# Patient Record
Sex: Male | Born: 1965 | Race: White | Hispanic: No | Marital: Married | Smoking: Current every day smoker
Health system: Southern US, Community
[De-identification: ages and names within clinical notes are randomized; demographics above are authoritative.]

## PROBLEM LIST (undated history)

## (undated) DIAGNOSIS — F32A Depression, unspecified: Secondary | ICD-10-CM

## (undated) DIAGNOSIS — E119 Type 2 diabetes mellitus without complications: Secondary | ICD-10-CM

## (undated) DIAGNOSIS — I10 Essential (primary) hypertension: Secondary | ICD-10-CM

## (undated) DIAGNOSIS — F419 Anxiety disorder, unspecified: Secondary | ICD-10-CM

## (undated) DIAGNOSIS — G709 Myoneural disorder, unspecified: Secondary | ICD-10-CM

## (undated) DIAGNOSIS — E785 Hyperlipidemia, unspecified: Secondary | ICD-10-CM

## (undated) DIAGNOSIS — T7840XA Allergy, unspecified, initial encounter: Secondary | ICD-10-CM

## (undated) HISTORY — DX: Allergy, unspecified, initial encounter: T78.40XA

## (undated) HISTORY — DX: Hyperlipidemia, unspecified: E78.5

## (undated) HISTORY — DX: Myoneural disorder, unspecified: G70.9

## (undated) HISTORY — DX: Type 2 diabetes mellitus without complications: E11.9

## (undated) HISTORY — DX: Anxiety disorder, unspecified: F41.9

## (undated) HISTORY — DX: Depression, unspecified: F32.A

---

## 2016-06-06 DIAGNOSIS — Z6826 Body mass index (BMI) 26.0-26.9, adult: Secondary | ICD-10-CM | POA: Diagnosis not present

## 2016-06-06 DIAGNOSIS — F411 Generalized anxiety disorder: Secondary | ICD-10-CM | POA: Diagnosis not present

## 2017-08-15 DIAGNOSIS — Z6826 Body mass index (BMI) 26.0-26.9, adult: Secondary | ICD-10-CM | POA: Diagnosis not present

## 2017-08-15 DIAGNOSIS — F410 Panic disorder [episodic paroxysmal anxiety] without agoraphobia: Secondary | ICD-10-CM | POA: Diagnosis not present

## 2017-08-15 DIAGNOSIS — F5221 Male erectile disorder: Secondary | ICD-10-CM | POA: Diagnosis not present

## 2018-02-26 DIAGNOSIS — Z6826 Body mass index (BMI) 26.0-26.9, adult: Secondary | ICD-10-CM | POA: Diagnosis not present

## 2018-02-26 DIAGNOSIS — L03319 Cellulitis of trunk, unspecified: Secondary | ICD-10-CM | POA: Diagnosis not present

## 2018-06-18 DIAGNOSIS — Z720 Tobacco use: Secondary | ICD-10-CM | POA: Diagnosis not present

## 2018-06-18 DIAGNOSIS — Z79899 Other long term (current) drug therapy: Secondary | ICD-10-CM | POA: Diagnosis not present

## 2018-06-18 DIAGNOSIS — F17200 Nicotine dependence, unspecified, uncomplicated: Secondary | ICD-10-CM | POA: Diagnosis not present

## 2018-06-18 DIAGNOSIS — R109 Unspecified abdominal pain: Secondary | ICD-10-CM | POA: Diagnosis not present

## 2018-06-18 DIAGNOSIS — I10 Essential (primary) hypertension: Secondary | ICD-10-CM | POA: Diagnosis not present

## 2018-06-18 DIAGNOSIS — R1031 Right lower quadrant pain: Secondary | ICD-10-CM | POA: Diagnosis not present

## 2018-06-19 DIAGNOSIS — N451 Epididymitis: Secondary | ICD-10-CM | POA: Diagnosis not present

## 2018-06-19 DIAGNOSIS — Z6826 Body mass index (BMI) 26.0-26.9, adult: Secondary | ICD-10-CM | POA: Diagnosis not present

## 2018-06-19 DIAGNOSIS — I10 Essential (primary) hypertension: Secondary | ICD-10-CM | POA: Diagnosis not present

## 2018-08-19 ENCOUNTER — Other Ambulatory Visit: Payer: Self-pay

## 2018-08-19 ENCOUNTER — Observation Stay (HOSPITAL_COMMUNITY)
Admission: EM | Admit: 2018-08-19 | Discharge: 2018-08-20 | Disposition: A | Discharge status: Home / Self Care | Payer: BLUE CROSS/BLUE SHIELD | Attending: Internal Medicine | Admitting: Internal Medicine

## 2018-08-19 ENCOUNTER — Emergency Department (HOSPITAL_COMMUNITY): Payer: BLUE CROSS/BLUE SHIELD

## 2018-08-19 ENCOUNTER — Encounter (HOSPITAL_COMMUNITY): Payer: Self-pay | Admitting: Emergency Medicine

## 2018-08-19 DIAGNOSIS — F10920 Alcohol use, unspecified with intoxication, uncomplicated: Secondary | ICD-10-CM

## 2018-08-19 DIAGNOSIS — F17210 Nicotine dependence, cigarettes, uncomplicated: Secondary | ICD-10-CM | POA: Insufficient documentation

## 2018-08-19 DIAGNOSIS — F10929 Alcohol use, unspecified with intoxication, unspecified: Secondary | ICD-10-CM | POA: Insufficient documentation

## 2018-08-19 DIAGNOSIS — Z79899 Other long term (current) drug therapy: Secondary | ICD-10-CM | POA: Insufficient documentation

## 2018-08-19 DIAGNOSIS — R404 Transient alteration of awareness: Secondary | ICD-10-CM | POA: Diagnosis not present

## 2018-08-19 DIAGNOSIS — R45851 Suicidal ideations: Secondary | ICD-10-CM | POA: Diagnosis not present

## 2018-08-19 DIAGNOSIS — F10129 Alcohol abuse with intoxication, unspecified: Secondary | ICD-10-CM | POA: Diagnosis present

## 2018-08-19 DIAGNOSIS — F32A Depression, unspecified: Secondary | ICD-10-CM | POA: Diagnosis present

## 2018-08-19 DIAGNOSIS — R945 Abnormal results of liver function studies: Secondary | ICD-10-CM | POA: Insufficient documentation

## 2018-08-19 DIAGNOSIS — F329 Major depressive disorder, single episode, unspecified: Secondary | ICD-10-CM | POA: Diagnosis not present

## 2018-08-19 DIAGNOSIS — R55 Syncope and collapse: Secondary | ICD-10-CM | POA: Diagnosis not present

## 2018-08-19 DIAGNOSIS — R0689 Other abnormalities of breathing: Secondary | ICD-10-CM | POA: Diagnosis not present

## 2018-08-19 DIAGNOSIS — F10120 Alcohol abuse with intoxication, uncomplicated: Secondary | ICD-10-CM | POA: Diagnosis not present

## 2018-08-19 DIAGNOSIS — R0902 Hypoxemia: Secondary | ICD-10-CM | POA: Diagnosis not present

## 2018-08-19 DIAGNOSIS — R4020 Unspecified coma: Secondary | ICD-10-CM | POA: Diagnosis not present

## 2018-08-19 LAB — CBC WITH DIFFERENTIAL/PLATELET
BASOS ABS: 0.1 10*3/uL (ref 0.0–0.1)
Basophils Relative: 1 %
EOS PCT: 3 %
Eosinophils Absolute: 0.2 10*3/uL (ref 0.0–0.7)
HCT: 37.8 % — ABNORMAL LOW (ref 39.0–52.0)
Hemoglobin: 12.9 g/dL — ABNORMAL LOW (ref 13.0–17.0)
LYMPHS PCT: 42 %
Lymphs Abs: 2.4 10*3/uL (ref 0.7–4.0)
MCH: 32.9 pg (ref 26.0–34.0)
MCHC: 34.1 g/dL (ref 30.0–36.0)
MCV: 96.4 fL (ref 78.0–100.0)
MONO ABS: 0.3 10*3/uL (ref 0.1–1.0)
Monocytes Relative: 6 %
Neutro Abs: 2.8 10*3/uL (ref 1.7–7.7)
Neutrophils Relative %: 48 %
PLATELETS: 235 10*3/uL (ref 150–400)
RBC: 3.92 MIL/uL — ABNORMAL LOW (ref 4.22–5.81)
RDW: 13.1 % (ref 11.5–15.5)
WBC: 5.8 10*3/uL (ref 4.0–10.5)

## 2018-08-19 LAB — COMPREHENSIVE METABOLIC PANEL
ALT: 47 U/L — ABNORMAL HIGH (ref 0–44)
AST: 30 U/L (ref 15–41)
Albumin: 3.9 g/dL (ref 3.5–5.0)
Alkaline Phosphatase: 62 U/L (ref 38–126)
Anion gap: 10 (ref 5–15)
BILIRUBIN TOTAL: 0.5 mg/dL (ref 0.3–1.2)
BUN: 9 mg/dL (ref 6–20)
CHLORIDE: 100 mmol/L (ref 98–111)
CO2: 24 mmol/L (ref 22–32)
Calcium: 9 mg/dL (ref 8.9–10.3)
Creatinine, Ser: 0.58 mg/dL — ABNORMAL LOW (ref 0.61–1.24)
GFR calc Af Amer: >60 mL/min (ref >60)
Glucose, Bld: 103 mg/dL — ABNORMAL HIGH (ref 70–99)
Potassium: 3.7 mmol/L (ref 3.5–5.1)
Sodium: 134 mmol/L — ABNORMAL LOW (ref 135–145)
TOTAL PROTEIN: 7.2 g/dL (ref 6.5–8.1)

## 2018-08-19 LAB — URINALYSIS, ROUTINE W REFLEX MICROSCOPIC
BACTERIA UA: NONE SEEN
BILIRUBIN URINE: NEGATIVE
GLUCOSE, UA: NEGATIVE mg/dL
HGB URINE DIPSTICK: NEGATIVE
Ketones, ur: NEGATIVE mg/dL
Nitrite: NEGATIVE
PROTEIN: NEGATIVE mg/dL
SPECIFIC GRAVITY, URINE: 1.008 (ref 1.005–1.030)
pH: 7 (ref 5.0–8.0)

## 2018-08-19 LAB — RAPID URINE DRUG SCREEN, HOSP PERFORMED
AMPHETAMINES: NOT DETECTED
BENZODIAZEPINES: NOT DETECTED
Barbiturates: NOT DETECTED
Cocaine: NOT DETECTED
Opiates: NOT DETECTED
Tetrahydrocannabinol: NOT DETECTED

## 2018-08-19 LAB — ETHANOL: ALCOHOL ETHYL (B): 229 mg/dL — AB (ref <10)

## 2018-08-19 LAB — TROPONIN I

## 2018-08-19 NOTE — ED Triage Notes (Addendum)
Pt brought in by EMS. Pts family called EMS after pt drank 4 mixed drinks and became unresponsive. Pt denies any drug use. Per family CPR was started at home by family member.

## 2018-08-19 NOTE — H&P (Signed)
Patient Demographics:    William Chapman, is a 52 y.o. male  MRN: [id number]   DOB - 01-02-1966  Admit Date - 08/19/2018  Outpatient Primary MD for the patient is Sasser, Clarene Critchley, MD   Assessment & Plan:    Active Problems:   Syncope and collapse   Alcohol abuse with intoxication/BAL 229   Depression    1)Syncope- place obs on telemetry monitored unit, watch for arrhythmias, check serial troponins and EKG for rule out ACS protocol, Hold off on  Echocardiogram and hold off on carotid artery Dopplers as syncope appears to be related to EtOH intoxication rather than any Neuro or Cardiovascular disorder at this time.  Neurochecks every 4 hours as ordered.  CT head without acute findings  2)Depression with suicidal ideation but no suicidal plan--- wife  states patient was just talking out of his head because he was drunk, one-to-one sitter for now, consider psychiatric evaluation once patient is sober, continue Effexor  3)EToh intoxication---- BAL 229, Lorazepam per CIWA protocol, folic acid/MVI/thiamine as ordered, IV banana bag ordered   With History of - PMHx--- Reviewed by me  Depression etoh abuse   Chief Complaint  Patient presents with  . Alcohol Intoxication      HPI:    William Chapman  is a 52 y.o. male with past medical history relevant for alcohol abuse and depression presents to the ED after passing out in his kitchen at home.   Wife and family friend at bedside  Patient's wife states that patient has been drinking due to family stress with son-in-law and daughter.... He drank quite a bit of vodka he got up to go get some food from the kitchen passed out and hit the back of his head on the floor, neighbor tried doing CPR but patient was not really out out...  When EMS arrived in route patient on  his side the patient had episode of emesis  Prior to passing out and after passing out patient denied chest pain, palpitation, dizziness headache visual disturbance or any other acute concerns  Specifically denies pleuritic symptoms or leg pains or leg swelling  No tongue biting, no incontinence no concerns about seizures  IN ED... Patient is lethargic but arousable, able to answer my questions but then falls back asleep  In ED----CT head is without acute findings, troponin negative, blood alcohol level is BAL- 229, urine drug screen is negative, UA unremarkable    Review of systems:    In addition to the HPI above,   A full Review of  Systems was done, all other systems reviewed are negative except as noted above in HPI    Social History:  Reviewed by me    Social History   Tobacco Use  . Smoking status: Current Every Day Smoker    Packs/day: 1.00  . Smokeless tobacco: Never Used  Substance Use Topics  . Alcohol use: Yes  Family History :  Reviewed by me   HTN   Home Medications:   Prior to Admission medications   Medication Sig Start Date End Date Taking? Authorizing Provider  venlafaxine XR (EFFEXOR-XR) 150 MG 24 hr capsule Take 150 mg by mouth daily with breakfast.  07/14/18  Yes [provider]     Allergies:    No Known Allergies   Physical Exam:   Vitals  Blood pressure 136/77, pulse 90, temperature 97.9 F (36.6 C), temperature source Oral, resp. rate 18, height 5\' 8"  (1.727 m), weight 81.6 kg, SpO2 96 %.  Physical Examination: General appearance -falls asleep easily,  and in no distress Mental status - alert, oriented to person, place, and time, intoxicated and falls asleep easily Eyes - sclera anicteric Neck - supple, no JVD elevation , Chest - clear  to auscultation bilaterally, symmetrical air movement,  Heart - S1 and S2 normal,  Abdomen - soft, nontender, nondistended, no masses or organomegaly Neurological -intoxicated and  falls asleep easily, neck supple without rigidity, cranial nerves II through XII intact, DTR's normal and symmetric Extremities - no pedal edema noted, intact peripheral pulses  Skin - warm, dry     Data Review:    CBC Recent Labs  Lab 08/19/18 2018  WBC 5.8  HGB 12.9*  HCT 37.8*  PLT 235  MCV 96.4  MCH 32.9  MCHC 34.1  RDW 13.1  LYMPHSABS 2.4  MONOABS 0.3  EOSABS 0.2  BASOSABS 0.1   ------------------------------------------------------------------------------------------------------------------  Chemistries  Recent Labs  Lab 08/19/18 2018  NA 134*  K 3.7  CL 100  CO2 24  GLUCOSE 103*  BUN 9  CREATININE 0.58*  CALCIUM 9.0  AST 30  ALT 47*  ALKPHOS 62  BILITOT 0.5   ------------------------------------------------------------------------------------------------------------------ estimated creatinine clearance is 105.7 mL/min (A) (by C-G formula based on SCr of 0.58 mg/dL (L)). ------------------------------------------------------------------------------------------------------------------ No results for input(s): TSH, T4TOTAL, T3FREE, THYROIDAB in the last 72 hours.  Invalid input(s): FREET3   Coagulation profile No results for input(s): INR, PROTIME in the last 168 hours. ------------------------------------------------------------------------------------------------------------------- No results for input(s): DDIMER in the last 72 hours. -------------------------------------------------------------------------------------------------------------------  Cardiac Enzymes Recent Labs  Lab 08/19/18 2018  TROPONINI <0.03   ------------------------------------------------------------------------------------------------------------------ No results found for: BNP   ---------------------------------------------------------------------------------------------------------------  Urinalysis    Component Value Date/Time   COLORURINE STRAW (A) 08/19/2018  2130   APPEARANCEUR CLEAR 08/19/2018 2130   LABSPEC 1.008 08/19/2018 2130   PHURINE 7.0 08/19/2018 2130   GLUCOSEU NEGATIVE 08/19/2018 2130   HGBUR NEGATIVE 08/19/2018 2130   BILIRUBINUR NEGATIVE 08/19/2018 2130   KETONESUR NEGATIVE 08/19/2018 2130   PROTEINUR NEGATIVE 08/19/2018 2130   NITRITE NEGATIVE 08/19/2018 2130   LEUKOCYTESUR TRACE (A) 08/19/2018 2130    ----------------------------------------------------------------------------------------------------------------   Imaging Results:    Dg Chest 2 View  Result Date: 08/19/2018 CLINICAL DATA:  52 year old who became unresponsive after 4 mixed drinks. Query syncope. EXAM: CHEST - 2 VIEW COMPARISON:  None. FINDINGS: Cardiomediastinal silhouette unremarkable. Mildly prominent bronchovascular markings diffusely and mild central peribronchial thickening. Lungs otherwise clear. No localized airspace consolidation. No pleural effusions. No pneumothorax. Normal pulmonary vascularity. Visualized bony thorax intact. IMPRESSION: Mild changes of chronic bronchitis and/or asthma. No acute cardiopulmonary disease. Electronically Signed   By: Hulan Saas M.D.   On: 08/19/2018 20:44   Ct Head Wo Contrast  Result Date: 08/20/2018 CLINICAL DATA:  52 y/o  M; syncopal episode with head injury. EXAM: CT HEAD WITHOUT CONTRAST TECHNIQUE: Contiguous axial images were obtained from  the base of the skull through the vertex without intravenous contrast. COMPARISON:  None. FINDINGS: Brain: No evidence of acute infarction, hemorrhage, hydrocephalus, extra-axial collection or mass lesion/mass effect. Vascular: No hyperdense vessel or unexpected calcification. Skull: Normal. Negative for fracture or focal lesion. Sinuses/Orbits: No acute finding. Other: None. IMPRESSION: Negative CT of the head. Electronically Signed   By: Mitzi Hansen M.D.   On: 08/20/2018 00:21    Radiological Exams on Admission: Dg Chest 2 View  Result Date:  08/19/2018 CLINICAL DATA:  52 year old who became unresponsive after 4 mixed drinks. Query syncope. EXAM: CHEST - 2 VIEW COMPARISON:  None. FINDINGS: Cardiomediastinal silhouette unremarkable. Mildly prominent bronchovascular markings diffusely and mild central peribronchial thickening. Lungs otherwise clear. No localized airspace consolidation. No pleural effusions. No pneumothorax. Normal pulmonary vascularity. Visualized bony thorax intact. IMPRESSION: Mild changes of chronic bronchitis and/or asthma. No acute cardiopulmonary disease. Electronically Signed   By: Hulan Saas M.D.   On: 08/19/2018 20:44   Ct Head Wo Contrast  Result Date: 08/20/2018 CLINICAL DATA:  52 y/o  M; syncopal episode with head injury. EXAM: CT HEAD WITHOUT CONTRAST TECHNIQUE: Contiguous axial images were obtained from the base of the skull through the vertex without intravenous contrast. COMPARISON:  None. FINDINGS: Brain: No evidence of acute infarction, hemorrhage, hydrocephalus, extra-axial collection or mass lesion/mass effect. Vascular: No hyperdense vessel or unexpected calcification. Skull: Normal. Negative for fracture or focal lesion. Sinuses/Orbits: No acute finding. Other: None. IMPRESSION: Negative CT of the head. Electronically Signed   By: Mitzi Hansen M.D.   On: 08/20/2018 00:21    DVT Prophylaxis -SCD  /heparin AM Labs Ordered, also please review Full Orders  Family Communication: Admission, patients condition and plan of care including tests being ordered have been discussed with the patient and his wife at bedside who indicate understanding and agree with the plan   Code Status - Full Code  Likely DC to  home  Condition   stable  Shon Hale M.D on 08/20/2018 at 2:44 AM Pager---[phone] Go to [email or web address] - password [email or web address] for contact info  [facility] - Office  [phone]

## 2018-08-19 NOTE — ED Notes (Signed)
Pt placed on 2L [place] due to O2 82% while sleeping. MD pickering notified.

## 2018-08-19 NOTE — ED Provider Notes (Signed)
[facility] EMERGENCY DEPARTMENT Provider Note   CSN: [id number] Arrival date & time: 08/19/18  1932     History   Chief Complaint Chief Complaint  Patient presents with  . Alcohol Intoxication    HPI William Chapman is a 52 y.o. male.  HPI Patient presents after syncopal episode.  Reportedly had been drinking somewhat heavily at home.  Does drink a couple drinks at a time but reportedly drank more today.  He is upset over some family issues that have been going on.  Was walking and then had a syncopal episode where he fell backwards.  Reported CPR had been done by a neighbor that was come by.  Reportedly was not breathing.  Woke up after it.  Is tearful now and states some suicidal statements.  Has been had trouble with his family.  Reported he had a syncopal episode a few years ago that he never went to the hospital for.  After the syncopal episode today patient vomited. History reviewed. No pertinent past medical history.  There are no active problems to display for this patient.   History reviewed. No pertinent surgical history.      Home Medications    Prior to Admission medications   Medication Sig Start Date End Date Taking? Authorizing Provider  HYDROcodone-acetaminophen (NORCO/VICODIN) 5-325 MG tablet Take 1 tablet by mouth every 6 (six) hours as needed.  08/11/18   [provider]  venlafaxine XR (EFFEXOR-XR) 150 MG 24 hr capsule Take 150 mg by mouth daily with breakfast.  07/14/18   [provider]    Family History No family history on file.  Social History Social History   Tobacco Use  . Smoking status: Current Every Day Smoker    Packs/day: 1.00  . Smokeless tobacco: Never Used  Substance Use Topics  . Alcohol use: Yes  . Drug use: Not Currently     Allergies   Patient has no allergy information on record.   Review of Systems Review of Systems  Constitutional: Negative for appetite change.  HENT: Negative for congestion.     Respiratory: Negative for shortness of breath.   Cardiovascular: Negative for chest pain.  Genitourinary: Negative for frequency.  Musculoskeletal: Negative for gait problem.  Neurological: Positive for syncope.  Psychiatric/Behavioral: Positive for suicidal ideas.     Physical Exam Updated Vital Signs BP (!) 146/109   Pulse 71   Resp 14   Ht 5\' 8"  (1.727 m)   Wt 81.6 kg   SpO2 99%   BMI 27.37 kg/m   Physical Exam  Constitutional: He appears well-developed.  HENT:  Head: Atraumatic.  Eyes: EOM are normal.  Cardiovascular: Normal rate.  Pulmonary/Chest: Effort normal.  Abdominal: Soft.  Musculoskeletal: He exhibits no edema.  Neurological: He is alert.  Skin: Skin is warm.  Psychiatric:  Patient is tearful.     ED Treatments / Results  Labs (all labs ordered are listed, but only abnormal results are displayed) Labs Reviewed  COMPREHENSIVE METABOLIC PANEL - Abnormal; Notable for the following components:      Result Value   Sodium 134 (*)    Glucose, Bld 103 (*)    Creatinine, Ser 0.58 (*)    ALT 47 (*)    All other components within normal limits  ETHANOL - Abnormal; Notable for the following components:   Alcohol, Ethyl (B) 229 (*)    All other components within normal limits  CBC WITH DIFFERENTIAL/PLATELET - Abnormal; Notable for the following components:  RBC 3.92 (*)    Hemoglobin 12.9 (*)    HCT 37.8 (*)    All other components within normal limits  TROPONIN I  RAPID URINE DRUG SCREEN, HOSP PERFORMED  URINALYSIS, ROUTINE W REFLEX MICROSCOPIC    EKG EKG Interpretation  Date/Time:  Wednesday August 19 2018 19:37:29 EDT Ventricular Rate:  88 PR Interval:    QRS Duration: 102 QT Interval:  366 QTC Calculation: 443 R Axis:   57 Text Interpretation:  Sinus rhythm RSR' in V1 or V2, probably normal variant Confirmed by Benjiman Core [phone]) on 08/19/2018 8:50:20 PM   Radiology Dg Chest 2 View  Result Date: 08/19/2018 CLINICAL DATA:   52 year old who became unresponsive after 4 mixed drinks. Query syncope. EXAM: CHEST - 2 VIEW COMPARISON:  None. FINDINGS: Cardiomediastinal silhouette unremarkable. Mildly prominent bronchovascular markings diffusely and mild central peribronchial thickening. Lungs otherwise clear. No localized airspace consolidation. No pleural effusions. No pneumothorax. Normal pulmonary vascularity. Visualized bony thorax intact. IMPRESSION: Mild changes of chronic bronchitis and/or asthma. No acute cardiopulmonary disease. Electronically Signed   By: Hulan Saas M.D.   On: 08/19/2018 20:44    Procedures Procedures (including critical care time)  Medications Ordered in ED Medications - No data to display   Initial Impression / Assessment and Plan / ED Course  I have reviewed the triage vital signs and the nursing notes.  Pertinent labs & imaging results that were available during my care of the patient were reviewed by me and considered in my medical decision making (see chart for details).     Patient presented after syncopal episode.  Had been drinking alcohol tonight and drinking somewhat heavily but then became unresponsive to the point that he required CPR.  Minus elevated alcohol lab work is reassuring.  EKG reassuring.  Had made some suicidal statements to staff members however.  With the syncope to the point of CPR I think patient would benefit from admission to the hospital.  Will discuss with hospitalist  Final Clinical Impressions(s) / ED Diagnoses   Final diagnoses:  Syncope, unspecified syncope type  Alcoholic intoxication without complication [facility])    ED Discharge Orders    None       Benjiman Core, MD 08/19/18 2150

## 2018-08-20 DIAGNOSIS — F329 Major depressive disorder, single episode, unspecified: Secondary | ICD-10-CM

## 2018-08-20 DIAGNOSIS — R55 Syncope and collapse: Secondary | ICD-10-CM

## 2018-08-20 DIAGNOSIS — F32A Depression, unspecified: Secondary | ICD-10-CM | POA: Diagnosis present

## 2018-08-20 DIAGNOSIS — S0990XA Unspecified injury of head, initial encounter: Secondary | ICD-10-CM | POA: Diagnosis not present

## 2018-08-20 DIAGNOSIS — F10129 Alcohol abuse with intoxication, unspecified: Secondary | ICD-10-CM | POA: Diagnosis not present

## 2018-08-20 LAB — BASIC METABOLIC PANEL
ANION GAP: 9 (ref 5–15)
BUN: 11 mg/dL (ref 6–20)
CALCIUM: 8.9 mg/dL (ref 8.9–10.3)
CO2: 25 mmol/L (ref 22–32)
Chloride: 105 mmol/L (ref 98–111)
Creatinine, Ser: 0.71 mg/dL (ref 0.61–1.24)
Glucose, Bld: 107 mg/dL — ABNORMAL HIGH (ref 70–99)
Potassium: 3.8 mmol/L (ref 3.5–5.1)
Sodium: 139 mmol/L (ref 135–145)

## 2018-08-20 LAB — HEPATIC FUNCTION PANEL
ALT: 45 U/L — AB (ref 0–44)
AST: 36 U/L (ref 15–41)
Albumin: 3.4 g/dL — ABNORMAL LOW (ref 3.5–5.0)
Alkaline Phosphatase: 61 U/L (ref 38–126)
Bilirubin, Direct: <0.1 mg/dL (ref 0.0–0.2)
TOTAL PROTEIN: 6.3 g/dL — AB (ref 6.5–8.1)
Total Bilirubin: 0.1 mg/dL — ABNORMAL LOW (ref 0.3–1.2)

## 2018-08-20 LAB — CBC
HCT: 36.9 % — ABNORMAL LOW (ref 39.0–52.0)
HEMOGLOBIN: 12.4 g/dL — AB (ref 13.0–17.0)
MCH: 32.9 pg (ref 26.0–34.0)
MCHC: 33.6 g/dL (ref 30.0–36.0)
MCV: 97.9 fL (ref 78.0–100.0)
PLATELETS: 222 10*3/uL (ref 150–400)
RBC: 3.77 MIL/uL — AB (ref 4.22–5.81)
RDW: 13.2 % (ref 11.5–15.5)
WBC: 5.3 10*3/uL (ref 4.0–10.5)

## 2018-08-20 LAB — TROPONIN I

## 2018-08-20 MED ORDER — THIAMINE HCL 100 MG PO TABS: 100.0000 mg | ORAL_TABLET | Freq: Every day | ORAL | 0 refills | Status: DC

## 2018-08-20 MED ORDER — FOLIC ACID 1 MG PO TABS: 1.0000 mg | ORAL_TABLET | Freq: Every day | ORAL | 0 refills | Status: DC

## 2018-08-20 MED ORDER — SODIUM CHLORIDE 0.9 % IV SOLN: 250.0000 mL | INTRAVENOUS | Status: DC | PRN

## 2018-08-20 MED ORDER — ACETAMINOPHEN 650 MG RE SUPP: 650.0000 mg | Freq: Four times a day (QID) | RECTAL | Status: DC | PRN

## 2018-08-20 MED ORDER — ADULT MULTIVITAMIN W/MINERALS CH
1.0000 | ORAL_TABLET | Freq: Every day | ORAL | Status: DC
  Administered 2018-08-20: 1 via ORAL
  Filled 2018-08-20: qty 1

## 2018-08-20 MED ORDER — LORAZEPAM 1 MG PO TABS: 1.0000 mg | ORAL_TABLET | Freq: Four times a day (QID) | ORAL | Status: DC | PRN

## 2018-08-20 MED ORDER — TRAZODONE HCL 50 MG PO TABS: 50.0000 mg | ORAL_TABLET | Freq: Every evening | ORAL | Status: DC | PRN

## 2018-08-20 MED ORDER — ONDANSETRON HCL 4 MG PO TABS: 4.0000 mg | ORAL_TABLET | Freq: Four times a day (QID) | ORAL | Status: DC | PRN

## 2018-08-20 MED ORDER — FOLIC ACID 1 MG PO TABS
1.0000 mg | ORAL_TABLET | Freq: Every day | ORAL | Status: DC
  Administered 2018-08-20: 1 mg via ORAL
  Filled 2018-08-20: qty 1

## 2018-08-20 MED ORDER — ALBUTEROL SULFATE (2.5 MG/3ML) 0.083% IN NEBU: 2.5000 mg | INHALATION_SOLUTION | RESPIRATORY_TRACT | Status: DC | PRN

## 2018-08-20 MED ORDER — HEPARIN SODIUM (PORCINE) 5000 UNIT/ML IJ SOLN
5000.0000 [IU] | Freq: Three times a day (TID) | INTRAMUSCULAR | Status: DC
  Administered 2018-08-20: 5000 [IU] via SUBCUTANEOUS
  Filled 2018-08-20: qty 1

## 2018-08-20 MED ORDER — THIAMINE HCL 100 MG/ML IJ SOLN
INTRAMUSCULAR | Status: AC
  Filled 2018-08-20: qty 2

## 2018-08-20 MED ORDER — ONDANSETRON HCL 4 MG/2ML IJ SOLN: 4.0000 mg | Freq: Four times a day (QID) | INTRAMUSCULAR | Status: DC | PRN

## 2018-08-20 MED ORDER — THIAMINE HCL 100 MG/ML IJ SOLN
Freq: Once | INTRAVENOUS | Status: AC
  Administered 2018-08-20: 02:00:00 via INTRAVENOUS
  Filled 2018-08-20: qty 1000

## 2018-08-20 MED ORDER — THIAMINE HCL 100 MG/ML IJ SOLN: 100.0000 mg | Freq: Every day | INTRAMUSCULAR | Status: DC

## 2018-08-20 MED ORDER — LORAZEPAM 2 MG/ML IJ SOLN: 1.0000 mg | Freq: Four times a day (QID) | INTRAMUSCULAR | Status: DC | PRN

## 2018-08-20 MED ORDER — SODIUM CHLORIDE 0.9% FLUSH
3.0000 mL | Freq: Two times a day (BID) | INTRAVENOUS | Status: DC
  Administered 2018-08-20: 3 mL via INTRAVENOUS

## 2018-08-20 MED ORDER — SODIUM CHLORIDE 0.9% FLUSH: 3.0000 mL | INTRAVENOUS | Status: DC | PRN

## 2018-08-20 MED ORDER — M.V.I. ADULT IV INJ
INJECTION | INTRAVENOUS | Status: AC
  Filled 2018-08-20: qty 10

## 2018-08-20 MED ORDER — VITAMIN B-1 100 MG PO TABS
100.0000 mg | ORAL_TABLET | Freq: Every day | ORAL | Status: DC
  Administered 2018-08-20: 100 mg via ORAL
  Filled 2018-08-20: qty 1

## 2018-08-20 MED ORDER — SODIUM CHLORIDE 0.9 % IV SOLN
INTRAVENOUS | Status: DC
  Administered 2018-08-20: 06:00:00 via INTRAVENOUS

## 2018-08-20 MED ORDER — FOLIC ACID 5 MG/ML IJ SOLN
INTRAMUSCULAR | Status: AC
  Filled 2018-08-20: qty 0.2

## 2018-08-20 MED ORDER — ADULT MULTIVITAMIN W/MINERALS CH: 1.0000 | ORAL_TABLET | Freq: Every day | ORAL | 0 refills | Status: DC

## 2018-08-20 MED ORDER — ACETAMINOPHEN 325 MG PO TABS: 650.0000 mg | ORAL_TABLET | Freq: Four times a day (QID) | ORAL | Status: DC | PRN

## 2018-08-20 MED ORDER — VENLAFAXINE HCL ER 75 MG PO CP24
150.0000 mg | ORAL_CAPSULE | Freq: Every day | ORAL | Status: DC
  Administered 2018-08-20: 150 mg via ORAL
  Filled 2018-08-20: qty 2

## 2018-08-20 MED ORDER — POLYETHYLENE GLYCOL 3350 17 G PO PACK: 17.0000 g | PACK | Freq: Every day | ORAL | Status: DC | PRN

## 2018-08-20 NOTE — Progress Notes (Signed)
Pt discharged home today per Dr. Mikhail. Pt's IV site D/C'd and WDL. Pt's VSS. Pt provided with home medication list, discharge instructions and prescriptions. Verbalized understanding. Pt left floor via WC in stable condition accompanied by NT. 

## 2018-08-20 NOTE — Discharge Instructions (Signed)
Alcohol Abuse and Nutrition °Alcohol abuse is any pattern of alcohol consumption that harms your health, relationships, or work. Alcohol abuse can affect how your body breaks down and absorbs nutrients from food by causing your liver to work abnormally. Additionally, many people who abuse alcohol do not eat enough carbohydrates, protein, fat, vitamins, and minerals. This can cause poor nutrition (malnutrition) and a lack of nutrients (nutrient deficiencies), which can lead to further complications. °Nutrients that are commonly lacking (deficient) among people who abuse alcohol include: °· Vitamins. °? Vitamin A. This is stored in your liver. It is important for your vision, metabolism, and ability to fight off infections (immunity). °? B vitamins. These include vitamins such as folate, thiamin, and niacin. These are important in new cell growth and maintenance. °? Vitamin C. This plays an important role in iron absorption, wound healing, and immunity. °? Vitamin D. This is produced by your liver, but you can also get vitamin D from food. Vitamin D is necessary for your body to absorb and use calcium. °· Minerals. °? Calcium. This is important for your bones and your heart and blood vessel (cardiovascular) function. °? Iron. This is important for blood, muscle, and nervous system functioning. °? Magnesium. This plays an important role in muscle and nerve function, and it helps to control blood sugar and blood pressure. °? Zinc. This is important for the normal function of your nervous system and digestive system (gastrointestinal tract). ° °Nutrition is an essential component of therapy for alcohol abuse. Your health care provider or dietitian will work with you to design a plan that can help restore nutrients to your body and prevent potential complications. °What is my plan? °Your dietitian may develop a specific diet plan that is based on your condition and any other complications you may have. A diet plan will  commonly include: °· A balanced diet. °? Grains: 6-8 oz per day. °? Vegetables: 2-3 cups per day. °? Fruits: 1-2 cups per day. °? Meat and other protein: 5-6 oz per day. °? Dairy: 2-3 cups per day. °· Vitamin and mineral supplements. ° °What do I need to know about alcohol and nutrition? °· Consume foods that are high in antioxidants, such as grapes, berries, nuts, green tea, and dark green and orange vegetables. This can help to counteract some of the stress that is placed on your liver by consuming alcohol. °· Avoid food and drinks that are high in fat and sugar. Foods such as sugared soft drinks, salty snack foods, and candy contain empty calories. This means that they lack important nutrients such as protein, fiber, and vitamins. °· Eat frequent meals and snacks. Try to eat 5-6 small meals each day. °· Eat a variety of fresh fruits and vegetables each day. This will help you get plenty of water, fiber, and vitamins in your diet. °· Drink plenty of water and other clear fluids. Try to drink at least 48-64 oz (1.5-2 L) of water per day. °· If you are a vegetarian, eat a variety of protein-rich foods. Pair whole grains with plant-based proteins at meals and snacks to obtain the greatest nutrient benefit from your food. For example, eat rice with beans, put peanut butter on whole-grain toast, or eat oatmeal with sunflower seeds. °· Soak beans and whole grains overnight before cooking. This can help your body to absorb the nutrients more easily. °· Include foods fortified with vitamins and minerals in your diet. Commonly fortified foods include milk, orange juice, cereal, and bread. °·   If you are malnourished, your dietitian may recommend a high-protein, high-calorie diet. This may include: °? 2,000-3,000 calories (kilocalories) per day. °? 70-100 grams of protein per day. °· Your health care provider may recommend a complete nutritional supplement beverage. This can help to restore calories, protein, and vitamins to  your body. Depending on your condition, you may be advised to consume this instead of or in addition to meals. °· Limit your intake of caffeine. Replace drinks like coffee and black tea with decaffeinated coffee and herbal tea. °· Eat a variety of foods that are high in omega fatty acids. These include fish, nuts and seeds, and soybeans. These foods may help your liver to recover and may also stabilize your mood. °· Certain medicines may cause changes in your appetite, taste, and weight. Work with your health care provider and dietitian to make any adjustments to your medicines and diet plan. °· Include other healthy lifestyle choices in your daily routine. °? Be physically active. °? Get enough sleep. °? Spend time doing activities that you enjoy. °· If you are unable to take in enough food and calories by mouth, your health care provider may recommend a feeding tube. This is a tube that passes through your nose and throat, directly into your stomach. Nutritional supplement beverages can be given to you through the feeding tube to help you get the nutrients you need. °· Take vitamin or mineral supplements as recommended by your health care provider. °What foods can I eat? °Grains °Enriched pasta. Enriched rice. Fortified whole-grain bread. Fortified whole-grain cereal. Barley. Brown rice. Quinoa. Millet. °Vegetables °All fresh, frozen, and canned vegetables. Spinach. Kale. Artichoke. Carrots. Winter squash and pumpkin. Sweet potatoes. Broccoli. Cabbage. Cucumbers. Tomatoes. Sweet peppers. Green beans. Peas. Corn. °Fruits °All fresh and frozen fruits. Berries. Grapes. Mango. Papaya. Guava. Cherries. Apples. Bananas. Peaches. Plums. Pineapple. Watermelon. Cantaloupe. Oranges. Avocado. °Meats and Other Protein Sources °Beef liver. Lean beef. Pork. Fresh and canned chicken. Fresh fish. Oysters. Sardines. Canned tuna. Shrimp. Eggs with yolks. Nuts and seeds. Peanut butter. Beans and lentils. Soybeans.  Tofu. °Dairy °Whole, low-fat, and nonfat milk. Whole, low-fat, and nonfat yogurt. Cottage cheese. Sour cream. Hard and soft cheeses. °Beverages °Water. Herbal tea. Decaffeinated coffee. Decaffeinated green tea. 100% fruit juice. 100% vegetable juice. Instant breakfast shakes. °Condiments °Ketchup. Mayonnaise. Mustard. Salad dressing. Barbecue sauce. °Sweets and Desserts °Sugar-free ice cream. Sugar-free pudding. Sugar-free gelatin. °Fats and Oils °Butter. Vegetable oil, flaxseed oil, olive oil, and walnut oil. °Other °Complete nutrition shakes. Protein bars. Sugar-free gum. °The items listed above may not be a complete list of recommended foods or beverages. Contact your dietitian for more options. °What foods are not recommended? °Grains °Sugar-sweetened breakfast cereals. Flavored instant oatmeal. Fried breads. °Vegetables °Breaded or deep-fried vegetables. °Fruits °Dried fruit with added sugar. Candied fruit. Canned fruit in syrup. °Meats and Other Protein Sources °Breaded or deep-fried meats. °Dairy °Flavored milks. Fried cheese curds or fried cheese sticks. °Beverages °Alcohol. Sugar-sweetened soft drinks. Sugar-sweetened tea. Caffeinated coffee and tea. °Condiments °Sugar. Honey. Agave nectar. Molasses. °Sweets and Desserts °Chocolate. Cake. Cookies. Candy. °Other °Potato chips. Pretzels. Salted nuts. Candied nuts. °The items listed above may not be a complete list of foods and beverages to avoid. Contact your dietitian for more information. °This information is not intended to replace advice given to you by your health care provider. Make sure you discuss any questions you have with your health care provider. °Document Released: 08/29/2005 Document Revised: 03/13/2016 Document Reviewed: 06/07/2014 °Elsevier Interactive Patient Education ©   2018 [employer] ° ° °Alcohol Intoxication °Alcohol intoxication occurs when a person no longer thinks clearly or functions well (becomes impaired) after drinking alcohol.  Intoxication can occur with even one drink. The level of impairment depends on: °· The amount of alcohol the person had. °· The person's age, gender, and weight. °· How often the person drinks. °· Whether the person has other medical conditions, such as diabetes, seizures, or a heart condition. ° °Alcohol intoxication can range in severity from mild to severe. The condition can be dangerous, especially when caused by drinking large amounts of alcohol in a short period of time (binge drinking) or if the person also took certain prescription medicines or recreational drugs. °What are the signs or symptoms? °Symptoms of mild alcohol intoxication include: °· Feeling relaxed or sleepy. °· Mild difficulty with: °? Coordination. °? Speech. °? Memory. °? Attention. ° °Symptoms of moderate alcohol intoxication include: °· Extreme emotions, such as anger or sadness. °· Moderate difficulty with: °? Coordination. °? Speech. °? Memory. °? Attention. ° °Symptoms of severe alcohol intoxication include: °· Passing out. °· Vomiting. °· Confusion. °· Slow breathing. °· Coma. °· Severe difficulty with: °? Coordination. °? Speech. °? Memory. °? Attention. ° °Intoxication, especially in people who are not exposed to alcohol often can progress from mild to severe quickly, and may even cause coma or death. °How is this diagnosed? °This condition may be diagnosed based on: °· A medical history. °· A physical exam. °· A blood test that measures the concentration of alcohol in the blood (blood alcohol content, or BAC). °· Whether there is a smell of alcohol on the breath. ° °Your health care provider will ask you how much alcohol you drank and what kind of alcohol you had. °How is this treated? °Usually, treatment is not needed for this condition. Most of the effects of alcohol are temporary and go away as the alcohol naturally leaves the body. Your health care provider may recommend monitoring until the alcohol level starts to drop and it  is safe to go home. You may also get fluids through an IV tube to help prevent dehydration. If the intoxication is severe, a breathing machine called a ventilator may be needed to support your breathing. °Follow these instructions at home: °· Do not drive after drinking alcohol. °· Have someone stay with you while you are intoxicated. You should not be left alone. °· Stay hydrated. Drink enough fluid to keep your urine clear or pale yellow. °· Avoid caffeine because it can dehydrate you. °· Take over-the-counter and prescription medicines only as told by your health care provider. °How is this prevented? °To prevent alcohol intoxication: °· Limit alcohol intake to no more than 1 drink a day for nonpregnant women and 2 drinks a day for men. One drink equals 12 oz of beer, 5 oz of wine, or 1½ oz of hard liquor. °· Do not drink alcohol on an empty stomach. °· Avoid drinking alcohol if: °? You are under the legal drinking age. °? You are pregnant or may be pregnant. °? You are taking medicines that should not be taken with alcohol. °? Your drinking causes your medical condition to get worse. °? You need to drive or perform activities that require your attention. °? You have substance use disorder. ° °To prevent potentially serious complications of alcohol intoxication, seek immediate medical care if you or someone you know has signs of moderate or severe alcohol intoxication. These include: °· Moderate or severe difficulty   with: °? Coordination. °? Speech. °? Memory. °? Attention. °· Passing out. °· Confusion. °· Vomiting. ° °Do not leave someone alone if he or she is intoxicated. °Contact a health care provider if: °· You do not feel better after a few days. °· You are having problems at work, at school, or at home due to drinking. °Get help right away if: °· You become shaky when you try to stop drinking. °· You shake uncontrollably (have a seizure). °· You vomit blood. Blood in vomit may look bright red, or it may  look like coffee grounds. °· You have blood in your stool. Blood in stool may be bright red, or it may make stool appear black and tarry and make it smell bad. °· You become light-headed or you faint. °If you ever feel like you may hurt yourself or others, or have thoughts about taking your own life, get help right away. You can go to your nearest emergency department or call: °· Your local emergency services (911 in the [place] °· A suicide crisis helpline, such as the National Suicide Prevention Lifeline at 1-800-273-8255. This is open 24 hours a day. ° °This information is not intended to replace advice given to you by your health care provider. Make sure you discuss any questions you have with your health care provider. °Document Released: 08/14/2005 Document Revised: 07/20/2016 Document Reviewed: 07/20/2016 °Elsevier Interactive Patient Education © 2018 Elsevier Inc. ° °

## 2018-08-20 NOTE — Discharge Summary (Signed)
Physician Discharge Summary  William Chapman ZOX:[id number] DOB: 15-Jun-1966 DOA: 08/19/2018  PCP: Estanislado Pandy, MD  Admit date: 08/19/2018 Discharge date: 08/20/2018  Time spent: 45 minutes  Recommendations for Outpatient Follow-up:  Patient will be discharged to home.  Patient will need to follow up with primary care provider within one week of discharge, repeat CMP.  Patient should continue medications as prescribed.  Patient should follow a regular diet.   Discharge Diagnoses:  Syncope Depression with questionable suicidal ideation Alcohol intoxication Mildly elevated LFT (ALT)  Discharge Condition: Stable  Diet recommendation: Regular  Filed Weights   08/19/18 1935  Weight: 81.6 kg    History of present illness:  On 08/19/2018 by Dr. Shon Hale Lathen Seal  is a 52 y.o. male with past medical history relevant for alcohol abuse and depression presents to the ED after passing out in his kitchen at home.  Wife and family friend at bedside Patient's wife states that patient has been drinking due to family stress with son-in-law and daughter.... He drank quite a bit of vodka he got up to go get some food from the kitchen passed out and hit the back of his head on the floor, neighbor tried doing CPR but patient was not really out out... When EMS arrived in route patient on his side the patient had episode of emesis Prior to passing out and after passing out patient denied chest pain, palpitation, dizziness headache visual disturbance or any other acute concerns Specifically denies pleuritic symptoms or leg pains or leg swelling No tongue biting, no incontinence no concerns about seizures  Hospital Course:  Syncope -Suspect secondary to alcohol intoxication -Neuro checks conducted and unremarkable -CT head was negative -No signs of infection, currently afebrile and has no leukocytosis -CXR and UA reviewed and unremarkable for infection -Placed on tele, no arrhythmias  noted -Do not feel that carotid doppler or echocardiogram are necessary as an inpatient- this can be done as an outpatient if his PCP deems it necessary -ordered orthostatic vitals and unremarkable   Depression with questionable suicidal ideation -Patient has no further suicidal ideation. Never had a plan.  -Wife at bedside agrees and feels this was due to alcohol -patient admits to have issues with his daughter which has been a major stressor in his life -Continue Effexor  Alcohol intoxication -Alcohol level on admission was 229 -Urine drug screen unremarkable -Placed on CIWA protocol, folic acid, multivitamin, thiamine -counseled patient  -Currently no signs of withdrawal  Mildly elevated LFT (ALT) -ALT 45 (normal range 44) -recommend repeat CMP in one week  Procedures: None  Consultations: None  Discharge Exam: Vitals:   08/20/18 0639 08/20/18 1031  BP: 126/90 (!) 163/98  Pulse: 70   Resp: 18   Temp: 97.9 F (36.6 C)   SpO2: 97%    Patient feeling much better today. Denies current chest pain, shortness of breath, abdominal pain, N/V/D/C, dizziness, headache.   General: Well developed, well nourished, NAD, appears stated age  HEENT: NCAT, mucous membranes moist.  Neck: Supple  Cardiovascular: S1 S2 auscultated, no murmur, RRR  Respiratory: Clear to auscultation bilaterally with equal chest rise  Abdomen: Soft, nontender, nondistended, + bowel sounds  Extremities: warm dry without cyanosis clubbing or edema  Neuro: AAOx3, nonfocal  Psych: Pleasant, appropriate mood and affect  Discharge Instructions Discharge Instructions    Discharge instructions   Complete by:  As directed    Patient will be discharged to home.  Patient will need to follow up  with primary care provider within one week of discharge, repeat CMP.  Patient should continue medications as prescribed.  Patient should follow a regular diet.   Discharge instructions   Complete by:  As  directed    Patient will be discharged to home.  Patient will need to follow up with primary care provider within one week of discharge, repeat CMP.  Patient should continue medications as prescribed.  Patient should follow a regular diet.     Allergies as of 08/20/2018   No Known Allergies     Medication List    TAKE these medications   folic acid 1 MG tablet Commonly known as:  FOLVITE Take 1 tablet (1 mg total) by mouth daily. Start taking on:  08/21/2018   multivitamin with minerals Tabs tablet Take 1 tablet by mouth daily. Start taking on:  08/21/2018   thiamine 100 MG tablet Take 1 tablet (100 mg total) by mouth daily. Start taking on:  08/21/2018   venlafaxine XR 150 MG 24 hr capsule Commonly known as:  EFFEXOR-XR Take 150 mg by mouth daily with breakfast.      No Known Allergies Follow-up Information    Sasser, Clarene Critchley, MD. Schedule an appointment as soon as possible for a visit in 1 week(s).   Specialty:  Family Medicine Why:  Hospital follow up, discuss possible need for echocardiogram  Contact information: [address] [place] [address] [phone]            The results of significant diagnostics from this hospitalization (including imaging, microbiology, ancillary and laboratory) are listed below for reference.    Significant Diagnostic Studies: Dg Chest 2 View  Result Date: 08/19/2018 CLINICAL DATA:  52 year old who became unresponsive after 4 mixed drinks. Query syncope. EXAM: CHEST - 2 VIEW COMPARISON:  None. FINDINGS: Cardiomediastinal silhouette unremarkable. Mildly prominent bronchovascular markings diffusely and mild central peribronchial thickening. Lungs otherwise clear. No localized airspace consolidation. No pleural effusions. No pneumothorax. Normal pulmonary vascularity. Visualized bony thorax intact. IMPRESSION: Mild changes of chronic bronchitis and/or asthma. No acute cardiopulmonary disease. Electronically Signed   By: Hulan Saas  M.D.   On: 08/19/2018 20:44   Ct Head Wo Contrast  Result Date: 08/20/2018 CLINICAL DATA:  52 y/o  M; syncopal episode with head injury. EXAM: CT HEAD WITHOUT CONTRAST TECHNIQUE: Contiguous axial images were obtained from the base of the skull through the vertex without intravenous contrast. COMPARISON:  None. FINDINGS: Brain: No evidence of acute infarction, hemorrhage, hydrocephalus, extra-axial collection or mass lesion/mass effect. Vascular: No hyperdense vessel or unexpected calcification. Skull: Normal. Negative for fracture or focal lesion. Sinuses/Orbits: No acute finding. Other: None. IMPRESSION: Negative CT of the head. Electronically Signed   By: Mitzi Hansen M.D.   On: 08/20/2018 00:21    Microbiology: No results found for this or any previous visit (from the past 240 hour(s)).   Labs: Basic Metabolic Panel: Recent Labs  Lab 08/19/18 2018 08/20/18 0530  NA 134* 139  K 3.7 3.8  CL 100 105  CO2 24 25  GLUCOSE 103* 107*  BUN 9 11  CREATININE 0.58* 0.71  CALCIUM 9.0 8.9   Liver Function Tests: Recent Labs  Lab 08/19/18 2018 08/20/18 0833  AST 30 36  ALT 47* 45*  ALKPHOS 62 61  BILITOT 0.5 0.1*  PROT 7.2 6.3*  ALBUMIN 3.9 3.4*   No results for input(s): LIPASE, AMYLASE in the last 168 hours. No results for input(s): AMMONIA in the last 168 hours. CBC:  Recent Labs  Lab 08/19/18 2018 08/20/18 0530  WBC 5.8 5.3  NEUTROABS 2.8  --   HGB 12.9* 12.4*  HCT 37.8* 36.9*  MCV 96.4 97.9  PLT 235 222   Cardiac Enzymes: Recent Labs  Lab 08/19/18 2018 08/20/18 0530  TROPONINI <0.03 <0.03   BNP: BNP (last 3 results) No results for input(s): BNP in the last 8760 hours.  ProBNP (last 3 results) No results for input(s): PROBNP in the last 8760 hours.  CBG: No results for input(s): GLUCAP in the last 168 hours.     Signed:  Edsel Petrin  [facility] 08/20/2018, 10:54 AM

## 2018-08-21 LAB — HIV ANTIBODY (ROUTINE TESTING W REFLEX): HIV SCREEN 4TH GENERATION: NONREACTIVE

## 2018-09-01 DIAGNOSIS — G4700 Insomnia, unspecified: Secondary | ICD-10-CM | POA: Diagnosis not present

## 2018-09-01 DIAGNOSIS — Z6836 Body mass index (BMI) 36.0-36.9, adult: Secondary | ICD-10-CM | POA: Diagnosis not present

## 2018-09-01 DIAGNOSIS — F1010 Alcohol abuse, uncomplicated: Secondary | ICD-10-CM | POA: Diagnosis not present

## 2018-09-01 DIAGNOSIS — R55 Syncope and collapse: Secondary | ICD-10-CM | POA: Diagnosis not present

## 2018-11-17 DIAGNOSIS — Z6826 Body mass index (BMI) 26.0-26.9, adult: Secondary | ICD-10-CM | POA: Diagnosis not present

## 2018-11-17 DIAGNOSIS — M109 Gout, unspecified: Secondary | ICD-10-CM | POA: Diagnosis not present

## 2019-02-04 DIAGNOSIS — J069 Acute upper respiratory infection, unspecified: Secondary | ICD-10-CM | POA: Diagnosis not present

## 2019-02-04 DIAGNOSIS — J209 Acute bronchitis, unspecified: Secondary | ICD-10-CM | POA: Diagnosis not present

## 2019-02-04 DIAGNOSIS — Z6827 Body mass index (BMI) 27.0-27.9, adult: Secondary | ICD-10-CM | POA: Diagnosis not present

## 2019-02-04 DIAGNOSIS — R509 Fever, unspecified: Secondary | ICD-10-CM | POA: Diagnosis not present

## 2019-05-20 DIAGNOSIS — Z6826 Body mass index (BMI) 26.0-26.9, adult: Secondary | ICD-10-CM | POA: Diagnosis not present

## 2019-05-20 DIAGNOSIS — I10 Essential (primary) hypertension: Secondary | ICD-10-CM | POA: Diagnosis not present

## 2019-05-20 DIAGNOSIS — M25511 Pain in right shoulder: Secondary | ICD-10-CM | POA: Diagnosis not present

## 2019-06-08 DIAGNOSIS — A084 Viral intestinal infection, unspecified: Secondary | ICD-10-CM | POA: Diagnosis not present

## 2019-06-08 DIAGNOSIS — J069 Acute upper respiratory infection, unspecified: Secondary | ICD-10-CM | POA: Diagnosis not present

## 2019-10-07 DIAGNOSIS — R208 Other disturbances of skin sensation: Secondary | ICD-10-CM | POA: Diagnosis not present

## 2019-10-07 DIAGNOSIS — M79672 Pain in left foot: Secondary | ICD-10-CM | POA: Diagnosis not present

## 2019-10-07 DIAGNOSIS — M722 Plantar fascial fibromatosis: Secondary | ICD-10-CM | POA: Diagnosis not present

## 2019-10-07 DIAGNOSIS — M79671 Pain in right foot: Secondary | ICD-10-CM | POA: Diagnosis not present

## 2019-10-11 DIAGNOSIS — E875 Hyperkalemia: Secondary | ICD-10-CM | POA: Diagnosis not present

## 2020-02-17 ENCOUNTER — Telehealth: Payer: Self-pay | Admitting: Orthopaedic Surgery

## 2020-02-17 NOTE — Telephone Encounter (Signed)
William Chapman called for her husband William Chapman.  She said he was seen at [facility] for foot pain due to a WC injury.  I told her that if he was going to continue with the [facility] insurance, we would have to have approval from the [facility] insurance.  She said she would have him call the adjustor and have them call us.

## 2020-02-21 IMAGING — DX DG CHEST 2V
2 series · 2 of 2 positions shown · non-contrast
Comparison: None.

CLINICAL DATA: 51-year-old who became unresponsive after 4 mixed
drinks. Query syncope.

EXAM:
CHEST - 2 VIEW

[chest pa]
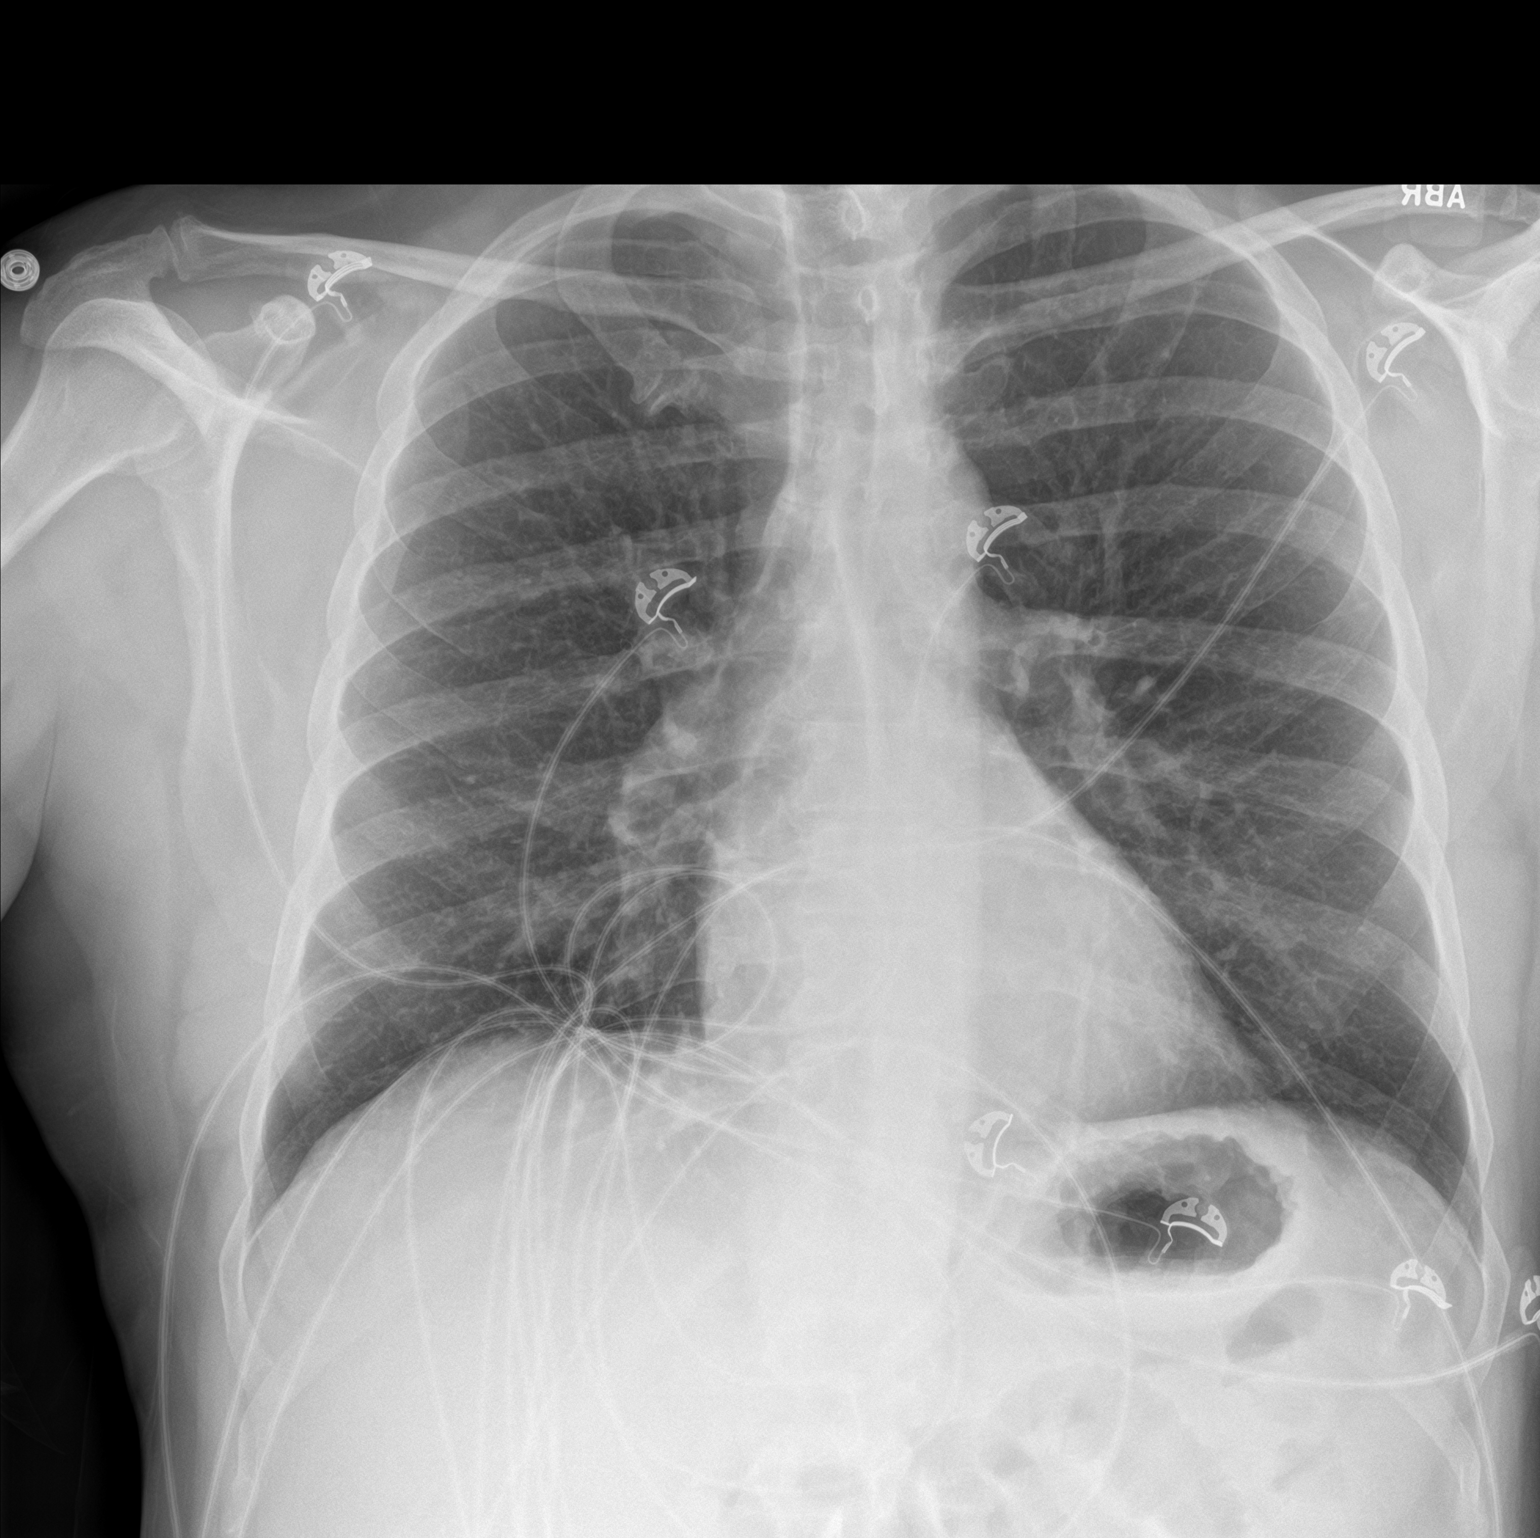

[chest lat]
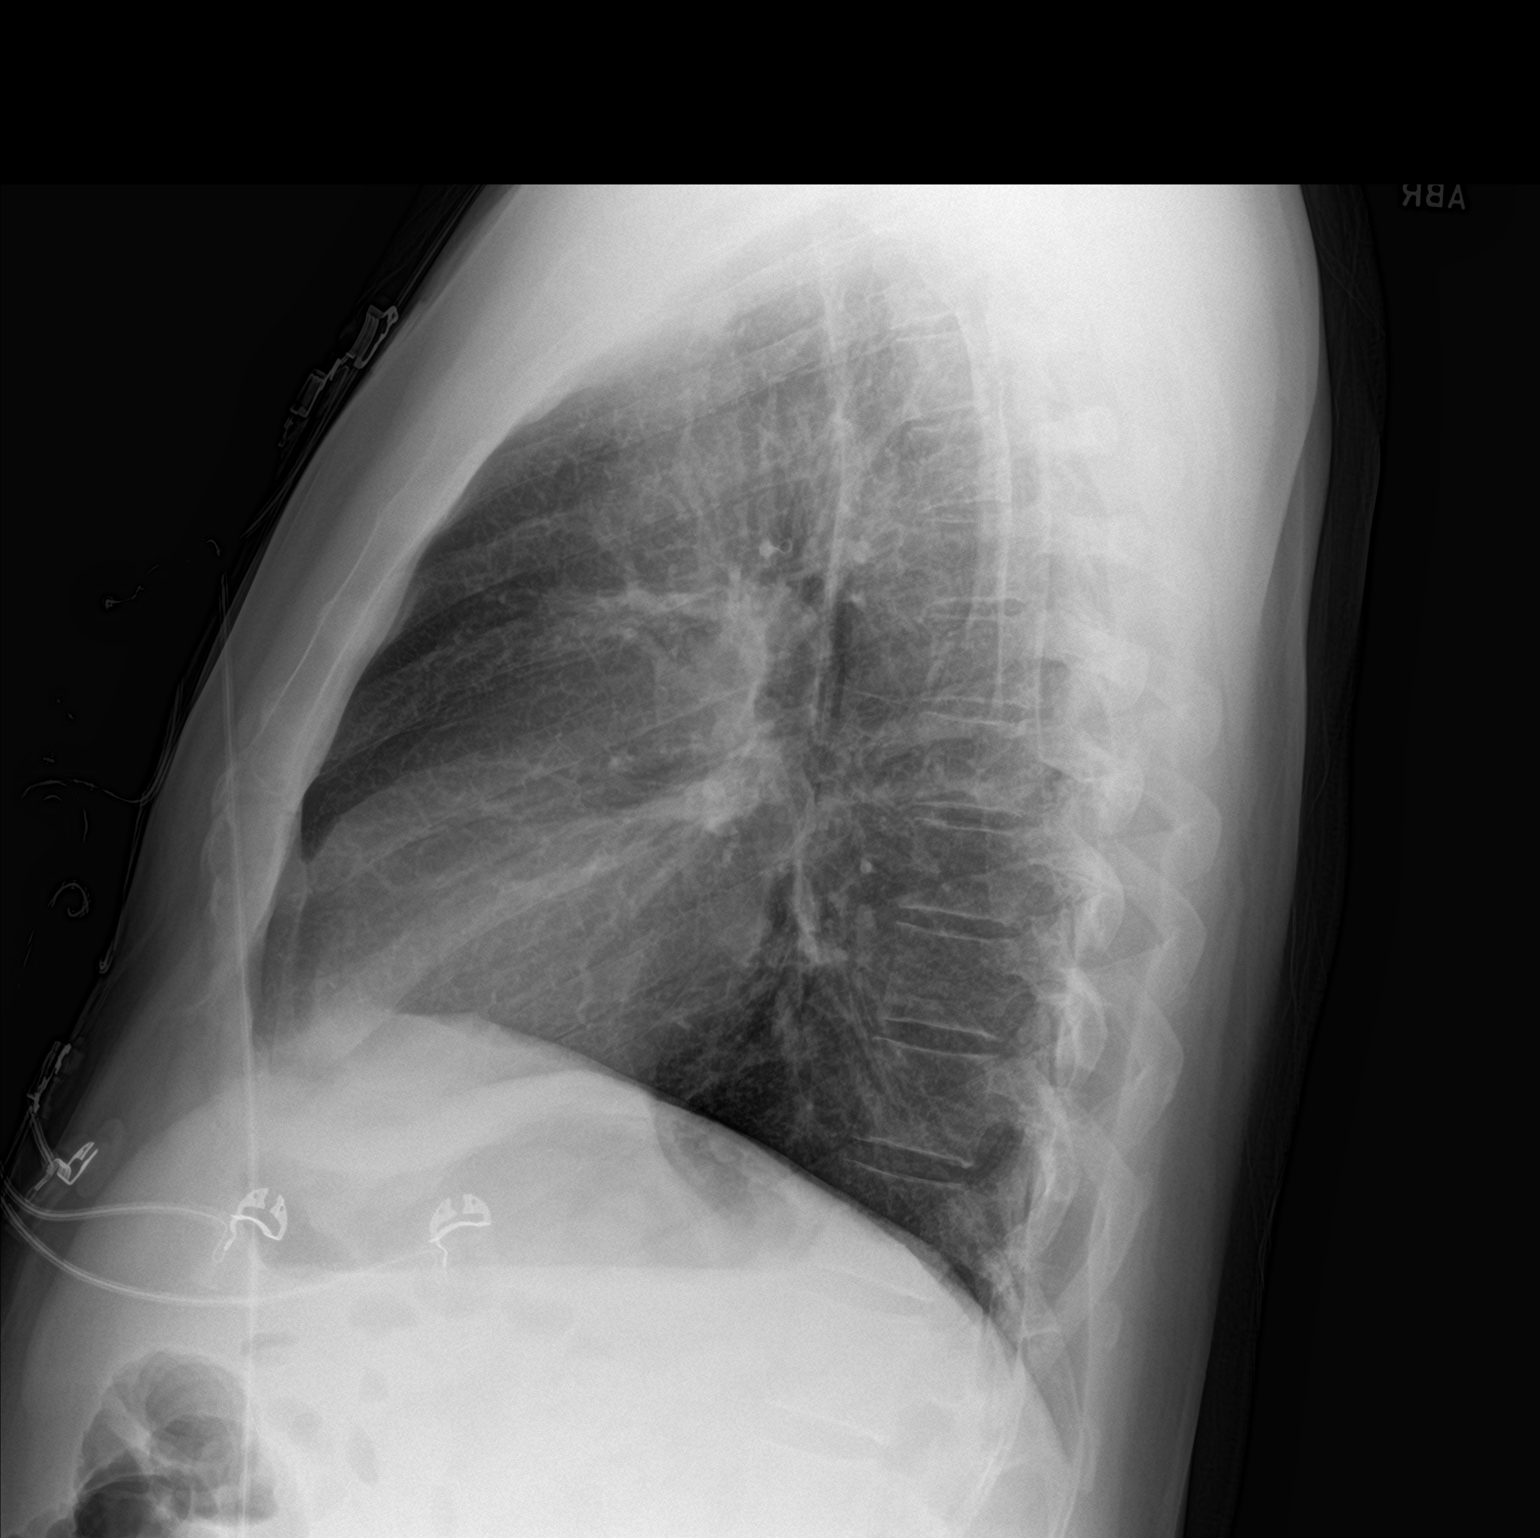

[2 of 2 positions shown; findings below may reference images not displayed]

FINDINGS: Cardiomediastinal silhouette unremarkable. Mildly prominent
bronchovascular markings diffusely and mild central peribronchial
thickening. Lungs otherwise clear. No localized airspace
consolidation. No pleural effusions. No pneumothorax. Normal
pulmonary vascularity. Visualized bony thorax intact.
IMPRESSION: Mild changes of chronic bronchitis and/or asthma. No acute
cardiopulmonary disease.

## 2020-02-23 ENCOUNTER — Telehealth: Payer: Self-pay | Admitting: Orthopaedic Surgery

## 2020-02-23 NOTE — Telephone Encounter (Signed)
Mr. Diliberto called this morning stating he now had the name of his WC adjustor, Diane.  Although, he did not know the name of the insurance company nor a phone number to call.  I told him that if he wants to continue with Workman's compensation for his injury, he would have to get the adjustor to call us to schedule an appointment here.  He said he would do this

## 2020-02-29 ENCOUNTER — Telehealth: Payer: Self-pay | Admitting: Orthopedic Surgery

## 2020-02-29 NOTE — Telephone Encounter (Signed)
Call received from [place] at [place], ph#[phone],fax#[phone], on behalf of adjuster Emily Filbert - adjuster's direct [phone], requesting appointment per previous calls, and per notes which appear in Epic / [facility], where patient's care has been started.  Appointment scheduled/work-in, for tomorrow with Dr Romeo Apple, based on diagnosis noted in epic chart. Patient aware of appointment and to bring films. Claim information received / set up form faxed to attention [place] as noted. (REF: CL# [id number])

## 2020-03-01 ENCOUNTER — Encounter: Payer: Self-pay | Admitting: Orthopedic Surgery

## 2020-03-01 ENCOUNTER — Other Ambulatory Visit: Payer: Self-pay

## 2020-03-01 ENCOUNTER — Telehealth: Payer: Self-pay | Admitting: Radiology

## 2020-03-01 ENCOUNTER — Ambulatory Visit (INDEPENDENT_AMBULATORY_CARE_PROVIDER_SITE_OTHER): Payer: No Typology Code available for payment source | Admitting: Orthopedic Surgery

## 2020-03-01 VITALS — BP 159/102 | HR 81 | Temp 97.5°F | Ht 68.0 in | Wt 196.4 lb

## 2020-03-01 DIAGNOSIS — S9032XA Contusion of left foot, initial encounter: Secondary | ICD-10-CM

## 2020-03-01 NOTE — Telephone Encounter (Signed)
Patient needs physical therapy  Choice of facility up to workers comp Will you reach out to them for approval?

## 2020-03-01 NOTE — Telephone Encounter (Signed)
Yes. Have printed order; will contact worker's comp today and will update referral note.

## 2020-03-01 NOTE — Progress Notes (Signed)
Chief Complaint  Patient presents with  . 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  On 12 March she had a large load of metal she dropped on his foot.  He was wearing a work IT consultant.  The load was heavy.  Since he is been to urgent care 3 times had 2 x-rays x-rays were negative he was thought to have cellulitis he was put on antibiotics he is here with continued swelling intermittent pain which comes and goes is worse at night associated with throbbing and redness and discoloration to the top of the foot near the distal ends of the metatarsals and he cannot get a shoe on  No past medical history on file.  No history of medical problems.psh  BP (!) 159/102   Pulse 81   Temp (!) 97.5 F (36.4 C)   Ht 5\' 8"  (1.727 m)   Wt 196 lb 6.4 oz (89.1 kg)   BMI 29.86 kg/m   Normal development grooming and hygiene alert and oriented x3 mood and affect normal he is ambulating with a definite limp favoring his left lower extremity has normal sensation in the foot the skin is discolored but you can tell the hematoma is resolving although still present he has normal movement in his toes no instability in the MTP joint ankle motion is normal he is tender over the dorsum of the foot sort of near the distal metatarsals.  Pulse and perfusion color normal  Overall appearance normal  2 sets of x-rays were reviewed both sets of x-rays show no fracture dislocation but soft tissue swelling and on the second set of x-rays we see the soft tissue swelling is less  Encounter Diagnosis  Name Primary?  . Contusion of left foot, initial encounter Yes    He has a resolving hematoma from a severe contusion to his left foot  Recommend contrast baths Ice and elevation Physical therapy to assist with edema  control  Out of work for 4 weeks for therapy Come back in 4 weeks to see if we can get a shoe on him.  His return to work is depending on getting a shoe on and that will support the foot and protected pain under good control and swelling down  Anticipate TED hose will be needed later  With an estimated MMI of 8 weeks

## 2020-03-01 NOTE — Patient Instructions (Addendum)
Start contrast baths  We will arrange therapy through [employer]  Continue ice and elevation  Out of work 4 weeks  Follow-up 4 weeks Foot Contusion A foot contusion is a deep bruise to the foot. Contusions are the result of an injury to tissues and muscle fibers under the skin. The injury causes bleeding under the skin. The skin over the contusion may turn blue, purple, or yellow. Minor injuries will cause a painless contusion, but more severe contusions may stay painful and swollen for a few weeks. What are the causes? This condition is usually caused by a hard hit or direct force to your foot, such as having a heavy object fall on your foot. What are the signs or symptoms? Symptoms of this condition include:  Swelling of the foot.  Pain and tenderness of the foot.  Discoloration of the foot. The area may have redness and then turn blue, purple, or yellow. How is this diagnosed? This condition may be diagnosed based on:  Your medical history.  A physical exam. In some cases, imaging tests may be done to check for other injuries. These may include:  An X-ray to check for broken bones (fractures).  CT scan or MRI to check for torn or injured ligaments. How is this treated? In general, the best treatment for a foot contusion is rest, ice, pressure (compression), and elevation. This is often called RICE therapy. An elastic wrap may be recommended to support your foot. Over-the-counter anti-inflammatory medicines may also be recommended for pain control. If your swelling or pain is severe, you may be given crutches. Follow these instructions at home: RICE therapy   Rest the injured area. Try to avoid standing or walking while your foot is painful.  If directed, put ice on the injured area. ? Put ice in a plastic bag. ? Place a towel between your skin and the bag. ? Leave the ice on for 20 minutes, 2-3 times a day.  If directed, apply light compression to the injured area  using an elastic wrap. Make sure the wrap is not too tight. Remove and reapply the wrap as told by your health care provider. If your toes become numb, cold, or blue, take the wrap off and reapply it more loosely.  Raise (elevate) the injured area above the level of your heart while you are sitting or lying down. General instructions  Take over-the-counter and prescription medicines only as told by your health care provider.  Use crutches as told by your health care provider, if this applies. Do not use the injured foot to support your body weight until your health care provider says that you can.  Do not use any products that contain nicotine or tobacco, such as cigarettes, e-cigarettes, and chewing tobacco. These can delay healing. If you need help quitting, ask your health care provider.  Keep all follow-up visits as told by your health care provider. This is important. Contact a health care provider if:  Your symptoms do not improve after several days of treatment.  You have redness, swelling, or pain in your foot or toes.  You have difficulty moving the injured area.  Your swelling or pain is not relieved with medicines. Get help right away if:  You have severe pain.  Your foot or toes become numb.  Your foot or toes become pale or cold.  You cannot move your foot or ankle.  Your foot is warm to the touch. Summary  A foot contusion is a deep  bruise to the foot.  This condition is usually caused by a hard hit or direct force to your foot.  Symptoms include swelling, pain, and discoloration in the injured area.  In general, the best treatment for a foot contusion is rest, ice, pressure (compression), and elevation. This information is not intended to replace advice given to you by your health care provider. Make sure you discuss any questions you have with your health care provider. Document Revised: 07/07/2018 Document Reviewed: 07/07/2018 Elsevier Patient Education   2020 [employer].

## 2020-03-09 ENCOUNTER — Telehealth: Payer: Self-pay | Admitting: Orthopedic Surgery

## 2020-03-09 NOTE — Telephone Encounter (Signed)
Patient has question and concern regarding his foot, swelling more; said therapist also noticed - he has been doing physical therapy at [facility] in [place].  Please advise.

## 2020-03-09 NOTE — Telephone Encounter (Signed)
I reached patient at 11:56am; appointment scheduled for tomorrow morning; patient aware.

## 2020-03-09 NOTE — Telephone Encounter (Addendum)
We need to get him in sooner for follow up if swelling is getting worse I left message for him to advise we will call to get him in sooner  He needs to ice elevate, and continue therapy.

## 2020-03-10 ENCOUNTER — Ambulatory Visit (INDEPENDENT_AMBULATORY_CARE_PROVIDER_SITE_OTHER): Payer: No Typology Code available for payment source | Admitting: Orthopedic Surgery

## 2020-03-10 ENCOUNTER — Other Ambulatory Visit: Payer: Self-pay

## 2020-03-10 ENCOUNTER — Encounter: Payer: Self-pay | Admitting: Orthopedic Surgery

## 2020-03-10 VITALS — BP 149/102 | HR 86 | Ht 68.0 in | Wt 196.0 lb

## 2020-03-10 DIAGNOSIS — S9032XD Contusion of left foot, subsequent encounter: Secondary | ICD-10-CM | POA: Diagnosis not present

## 2020-03-10 NOTE — Progress Notes (Signed)
No chief complaint on file.   Encounter Diagnosis  Name Primary?  . Contusion of left foot, subsequent encounter Yes   Initial visit  Chief Complaint  Patient presents with  . 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  On 12 March she had a large load of metal she dropped on his foot.  He was wearing a work IT consultant.  The load was heavy.  Since he is been to urgent care 3 times had 2 x-rays x-rays were negative he was thought to have cellulitis he was put on antibiotics he is here with continued swelling intermittent pain which comes and goes is worse at night associated with throbbing and redness and discoloration to the top of the foot near the distal ends of the metatarsals and he cannot get a shoe on  _____________________________________________________________________________________________________________________________________________________________________________________________________  Today William Chapman comes in with lack of progression in terms of decrease in the amount of swelling in the foot.  I do not see any major changes in the foot other than the swelling has sort of coalesced to an area more distal versus the whole foot being swollen  He tells me is icing once a day contrast baths once a day  I recommend 4 times a day contrast baths 4 times a day ice and add a compression stocking continue with therapy and can see me as regularly scheduled

## 2020-03-10 NOTE — Patient Instructions (Addendum)
Do the contrast baths and the ice more often / ice at least 4 times daily Get the compression hose today/ you need them now. If you wait for workers comp to approve them it may be a few more days, so keep the receipt and ask them to reimburse you for these  The pain across top of your foot is likely from a bruised nerve, and this will take a longer time to heal.  Continue physical therapy.   Keep your appointment as scheduled on 03/31/20

## 2020-03-31 ENCOUNTER — Ambulatory Visit (INDEPENDENT_AMBULATORY_CARE_PROVIDER_SITE_OTHER): Payer: No Typology Code available for payment source | Admitting: Orthopedic Surgery

## 2020-03-31 ENCOUNTER — Other Ambulatory Visit: Payer: Self-pay

## 2020-03-31 ENCOUNTER — Telehealth: Payer: Self-pay | Admitting: Radiology

## 2020-03-31 ENCOUNTER — Encounter: Payer: Self-pay | Admitting: Orthopedic Surgery

## 2020-03-31 VITALS — BP 196/101 | HR 88 | Ht 68.0 in | Wt 196.0 lb

## 2020-03-31 DIAGNOSIS — S9032XD Contusion of left foot, subsequent encounter: Secondary | ICD-10-CM | POA: Diagnosis not present

## 2020-03-31 NOTE — Telephone Encounter (Signed)
Yes, in progress.

## 2020-03-31 NOTE — Progress Notes (Signed)
Chief Complaint  Patient presents with  . Foot Injury    left/ decreased swelling still has pain March 12th 2021   The patient did exhibit decreased swelling although he still having a lot of pain at the site of impact  His ankle range of motion is good but the swelling right at the midfoot is still quite significant as is the pain  At this point a CT scan needs to be done to rule out occult fracture  He needs to continue with his stocking which is definitely helping as is the physical therapy.  He was able to walk with a cane although he still has a limp  I will see him in 4 weeks while he continues his therapy in the meantime we will try to get a CAT scan ordered

## 2020-03-31 NOTE — Telephone Encounter (Signed)
Will you reach out to [facility] for approval for the CT scan of his left foot?

## 2020-03-31 NOTE — Patient Instructions (Signed)
ICE  ELEVATE   STOCKING  THERAPY

## 2020-04-12 ENCOUNTER — Ambulatory Visit: Payer: Self-pay | Admitting: Orthopedic Surgery

## 2020-04-28 ENCOUNTER — Ambulatory Visit (INDEPENDENT_AMBULATORY_CARE_PROVIDER_SITE_OTHER): Payer: No Typology Code available for payment source | Admitting: Orthopedic Surgery

## 2020-04-28 ENCOUNTER — Other Ambulatory Visit: Payer: Self-pay

## 2020-04-28 ENCOUNTER — Telehealth: Payer: Self-pay | Admitting: Radiology

## 2020-04-28 ENCOUNTER — Encounter: Payer: Self-pay | Admitting: Orthopedic Surgery

## 2020-04-28 VITALS — BP 141/100 | HR 84 | Ht 68.0 in | Wt 196.0 lb

## 2020-04-28 DIAGNOSIS — S9032XD Contusion of left foot, subsequent encounter: Secondary | ICD-10-CM

## 2020-04-28 NOTE — Progress Notes (Signed)
Chief Complaint  Patient presents with  . Follow-up    Recheck on left foot, DOI 01-28-20. Worker's comp.   William Chapman continues to have difficulty with swelling when he standing on his feet for a long period of time.  He can only walk for a few feet and then he starts having increased pain and swelling  The good news is that his swelling is going down although the foot still swells.  His skin shows good wrinkling today he has dull ache over the top of the foot distal third towards the toes  His ankle range of motion is now normal he has good stable drawer test  We are still waiting for CT scan  Recommend he continue his therapy to work on standing and edema  Follow-up in a month

## 2020-04-28 NOTE — Patient Instructions (Signed)
No work follow-up in 1 month

## 2020-04-28 NOTE — Telephone Encounter (Signed)
Dr Rexene Edison has ordered PT for him, can we try to get WC to approve?also still need CT

## 2020-04-28 NOTE — Telephone Encounter (Signed)
Faxing today's visit notes, therapy order, and re-sending CT order, awaiting approval.

## 2020-05-08 ENCOUNTER — Encounter: Payer: Self-pay | Admitting: Orthopedic Surgery

## 2020-05-29 ENCOUNTER — Other Ambulatory Visit: Payer: Self-pay

## 2020-05-29 ENCOUNTER — Telehealth: Payer: Self-pay | Admitting: Orthopedic Surgery

## 2020-05-29 ENCOUNTER — Encounter: Payer: Self-pay | Admitting: Orthopedic Surgery

## 2020-05-29 ENCOUNTER — Ambulatory Visit (INDEPENDENT_AMBULATORY_CARE_PROVIDER_SITE_OTHER): Payer: No Typology Code available for payment source | Admitting: Orthopedic Surgery

## 2020-05-29 VITALS — BP 172/115 | HR 85 | Ht 69.0 in | Wt 198.2 lb

## 2020-05-29 DIAGNOSIS — S9032XD Contusion of left foot, subsequent encounter: Secondary | ICD-10-CM | POA: Diagnosis not present

## 2020-05-29 NOTE — Progress Notes (Signed)
Chief Complaint  Patient presents with  . Foot Pain    L/ hurting this morning about a 6 on pain scale    54 year old male had a crush injury to his left foot we finally got a CAT scan on his foot showed no acute or subacute fractures  He has been able to walk 4 to 5 hours/day and pain and swelling, unbearable  Foot is still swollen still tender at the site of impact but skin is wrinkling he has some blue discoloration to it from chronic edema  He has some sensory changes on the dorsum of the foot he can move his toes well down sluggish dorsiflexing and extending them  Recommend to continue edema control with compression stocking try to increase weight daily elevate as needed   Follow-up in a month  Encounter Diagnosis  Name Primary?  . Contusion of left foot, subsequent encounter Yes

## 2020-05-29 NOTE — Patient Instructions (Signed)

## 2020-05-29 NOTE — Telephone Encounter (Signed)
Patient presented with film(CD) from [facility], [place]. After today's office visit, he asked to take it home with him; aware to retain it.

## 2020-06-22 ENCOUNTER — Telehealth: Payer: Self-pay | Admitting: Orthopedic Surgery

## 2020-06-22 NOTE — Telephone Encounter (Signed)
Take a few days off rom therapy   Soak foot 3 x aday in epsom salt and warm water   No weight bearing for 48 hrs

## 2020-06-22 NOTE — Telephone Encounter (Signed)
I called him to advise. Left message for him to call back

## 2020-06-22 NOTE — Telephone Encounter (Signed)
Patient called to relay he has been continuing therapy at [facility], [facility]; said had a session last night, and has been hurting a lot ever since. Said he's feeling worse than at the beginning. Aware of appointment on 06/28/20. Please advise of any recommendations.

## 2020-06-27 NOTE — Telephone Encounter (Signed)
He has appointment tomorrow will need to discuss then

## 2020-06-28 ENCOUNTER — Encounter: Payer: Self-pay | Admitting: Orthopedic Surgery

## 2020-06-28 ENCOUNTER — Ambulatory Visit (INDEPENDENT_AMBULATORY_CARE_PROVIDER_SITE_OTHER): Payer: No Typology Code available for payment source | Admitting: Orthopedic Surgery

## 2020-06-28 ENCOUNTER — Other Ambulatory Visit: Payer: Self-pay

## 2020-06-28 VITALS — Ht 69.0 in | Wt 198.0 lb

## 2020-06-28 DIAGNOSIS — S9032XD Contusion of left foot, subsequent encounter: Secondary | ICD-10-CM | POA: Diagnosis not present

## 2020-06-28 NOTE — Patient Instructions (Signed)
Continue therapy   Soak epsom salts   Stocking   Elevation   update note continue OOW thrus 1 month

## 2020-06-28 NOTE — Progress Notes (Signed)
Chief Complaint  Patient presents with  . Follow-up    1 month recheck on left foot, WC.   DOI January 28 2020  54 year old male large object fell on his left foot he had a CAT scan recently that showed no fracture no injury to the Lisfranc joint  He still has significant mount of swelling in the foot altered sensation in the plantar and dorsal aspects of the foot with the most of his swelling near the metatarsophalangeal joints  At this point I think we are doing everything right this is an odd injury he is injuries of the dependent portion of the foot pressure gradient there is leads to swelling he definitely had a crush injury there with soft tissue injuries which we cannot definitively specify but no they are present  Recommend continued compression hose  Stockings  Epson salt soaks  Weightbearing as he can tolerate  THERAPY   Follow-up in a month

## 2020-07-03 ENCOUNTER — Telehealth: Payer: Self-pay

## 2020-07-03 DIAGNOSIS — S9032XD Contusion of left foot, subsequent encounter: Secondary | ICD-10-CM

## 2020-07-03 NOTE — Telephone Encounter (Signed)
Faxed thanks!

## 2020-07-03 NOTE — Telephone Encounter (Signed)
William Chapman at [facility] and PT called stating that they need a new referral for this patient. 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

## 2020-07-26 DIAGNOSIS — S9032XA Contusion of left foot, initial encounter: Secondary | ICD-10-CM | POA: Insufficient documentation

## 2020-07-27 ENCOUNTER — Encounter: Payer: Self-pay | Admitting: Orthopedic Surgery

## 2020-07-27 ENCOUNTER — Other Ambulatory Visit: Payer: Self-pay

## 2020-07-27 ENCOUNTER — Ambulatory Visit (INDEPENDENT_AMBULATORY_CARE_PROVIDER_SITE_OTHER): Payer: No Typology Code available for payment source | Admitting: Orthopedic Surgery

## 2020-07-27 VITALS — BP 177/121 | HR 80 | Ht 69.0 in | Wt 195.0 lb

## 2020-07-27 DIAGNOSIS — G90522 Complex regional pain syndrome I of left lower limb: Secondary | ICD-10-CM | POA: Diagnosis not present

## 2020-07-27 DIAGNOSIS — S9032XD Contusion of left foot, subsequent encounter: Secondary | ICD-10-CM | POA: Diagnosis not present

## 2020-07-27 MED ORDER — CLONIDINE HCL 0.1 MG PO TABS: 0.1000 mg | ORAL_TABLET | Freq: Once | ORAL | 11 refills | Status: DC

## 2020-07-27 MED ORDER — GABAPENTIN 300 MG PO CAPS: 300.0000 mg | ORAL_CAPSULE | Freq: Every day | ORAL | 5 refills | Status: DC

## 2020-07-27 NOTE — Progress Notes (Signed)
Chief Complaint  Patient presents with  . Foot Pain    left foot, DOI 03/21.    Encounter Diagnoses  Name Primary?  . Complex regional pain syndrome type 1 of left lower extremity Yes  . Contusion of left foot, subsequent encounter    Various having lateral column pain some increased pain at night and persistent pain on the bottom of the foot throughout the metatarsals  Today's foot evaluation shows that his swelling is consistently mild to moderate seems to be mild today he has tenderness under the metatarsals of all 5 digits tenderness over the top of the foot at the area of the insult  In the standing position appears to low the lateral column versus the medial column  Recommend orthotics evaluation for possible unload lateral column and support medial column  Start the following medications for possible RSD  Meds ordered this encounter  Medications  . cloNIDine (CATAPRES) 0.1 MG tablet    Sig: Take 1 tablet (0.1 mg total) by mouth once for 1 dose.    Dispense:  60 tablet    Refill:  11  . gabapentin (NEURONTIN) 300 MG capsule    Sig: Take 1 capsule (300 mg total) by mouth at bedtime.    Dispense:  30 capsule    Refill:  5    Encounter Diagnoses  Name Primary?  . Complex regional pain syndrome type 1 of left lower extremity Yes  . Contusion of left foot, subsequent encounter     Start whirlpool therapy during his PT visits and come back in a month no work

## 2020-07-27 NOTE — Patient Instructions (Signed)
1.  Add whirlpool therapy for PT visits   #2 orthotic evaluation for overload lateral column of the foot  #3 start new medications at night for possible complex regional pain syndrome  #4 out of work times one of the month  #5 continue therapy times another month

## 2020-07-28 ENCOUNTER — Ambulatory Visit: Payer: No Typology Code available for payment source | Admitting: Orthopedic Surgery

## 2020-08-18 ENCOUNTER — Telehealth: Payer: Self-pay | Admitting: Orthopedic Surgery

## 2020-08-18 NOTE — Telephone Encounter (Signed)
Stephanie from the [employer] called regarding the order for patient to have orthotics.    She was asking if we are dispensing the orthotics from our office.  She has questions regarding the billing costs and L codes  I told her that I would have to have someone from our clinical staff call her back regarding how this is being handled.    Please call Judeth Cornfield at [phone]

## 2020-08-18 NOTE — Telephone Encounter (Signed)
Angie, you know we dont have orthotics  Will you let them know patient has prescription for [employer]

## 2020-08-22 NOTE — Telephone Encounter (Signed)
Spoke with [employer] [id number], third party for patient's workers comp, as they called back to check on status of orthotics. I relayed we do not provide orthotics or do fittings at our clinic. States they do have a place where they can refer patient, and will reach back out to patient. (states they understood patient had an appointment here for this on 08/24/20, again, to which I responded we do not do here)

## 2020-08-24 ENCOUNTER — Ambulatory Visit (INDEPENDENT_AMBULATORY_CARE_PROVIDER_SITE_OTHER): Payer: No Typology Code available for payment source | Admitting: Orthopedic Surgery

## 2020-08-24 ENCOUNTER — Encounter: Payer: Self-pay | Admitting: Orthopedic Surgery

## 2020-08-24 ENCOUNTER — Other Ambulatory Visit: Payer: Self-pay

## 2020-08-24 VITALS — BP 178/117 | HR 79 | Ht 69.0 in | Wt 195.0 lb

## 2020-08-24 DIAGNOSIS — G90522 Complex regional pain syndrome I of left lower limb: Secondary | ICD-10-CM | POA: Diagnosis not present

## 2020-08-24 DIAGNOSIS — S9032XD Contusion of left foot, subsequent encounter: Secondary | ICD-10-CM

## 2020-08-24 MED ORDER — IBUPROFEN 800 MG PO TABS: 800.0000 mg | ORAL_TABLET | Freq: Three times a day (TID) | ORAL | 1 refills | Status: DC | PRN

## 2020-08-24 NOTE — Patient Instructions (Signed)
Continue catapress Gabapentin  Start ibuprofen 800 3 xa day

## 2020-08-24 NOTE — Progress Notes (Signed)
Chief Complaint  Patient presents with  . Foot Injury    March 12th injury / left foot / same    Chief Complaint  Patient presents with  . Foot Pain      left foot, DOI 03/21.         Encounter Diagnoses  Name Primary?  . Complex regional pain syndrome type 1 of left lower extremity Yes  . Contusion of left foot, subsequent encounter            Meds ordered this encounter  Medications  . cloNIDine (CATAPRES) 0.1 MG tablet      Sig: Take 1 tablet (0.1 mg total) by mouth once for 1 dose.      Dispense:  60 tablet      Refill:  11  . gabapentin (NEURONTIN) 300 MG capsule      Sig: Take 1 capsule (300 mg total) by mouth at bedtime.      Dispense:  30 capsule      Refill:  5   Dillan indicates he is doing better in terms of the numbness tingling with the clonidine and gabapentin still has swelling of his foot which prevents him from wearing a normal shoe he is trying some sketchers and is using a cane  His foot still has the weird sensation on the bottom is moving his toes skin wrinkles well, pulse and perfusion are normal still has some hypersensitive activity to touch and soreness and aching on the dorsum  Recommend continue Catapres gabapentin at ibuprofen  Meds ordered this encounter  Medications  . ibuprofen (ADVIL) 800 MG tablet    Sig: Take 1 tablet (800 mg total) by mouth every 8 (eight) hours as needed.    Dispense:  90 tablet    Refill:  1    Get orthotics they have been ordered should get him in a couple of weeks come back in a month

## 2020-08-28 ENCOUNTER — Telehealth: Payer: Self-pay | Admitting: Orthopedic Surgery

## 2020-08-28 NOTE — Telephone Encounter (Signed)
Check with me? Ok  Call   Aph  [employer] [facility]

## 2020-08-28 NOTE — Telephone Encounter (Signed)
Gabby from pt's workman's comp called regarding getting William Chapman scheduled for "whirlpool" therapy.  She said she does not know of anywhere that does whirlpool and wants to know if we know of any facility that does do whirlpool.  I told her that I would check with you and someone would give her a call back.  Please call her at [phone]  Thanks

## 2020-08-28 NOTE — Telephone Encounter (Signed)
None of these have whirlpool  [facility] [address]  is the only place I am aware of  Ok to send there if [facility] approves?

## 2020-08-29 NOTE — Telephone Encounter (Signed)
yes

## 2020-08-29 NOTE — Telephone Encounter (Signed)
Called back to [employer] comp third party contact regarding therapy, [employer], [phone]; relayed to representative Freida Busman, who will reach out to [facility] to confirm whirlpool; if so, will reach back out to worker's comp case Production designer, theatre[employer]television[employer]film for approval.

## 2020-08-29 NOTE — Telephone Encounter (Signed)
[facility] [address]  is the only place I am aware of  Ok to send there if WC approves

## 2020-09-21 ENCOUNTER — Encounter: Payer: Self-pay | Admitting: Orthopedic Surgery

## 2020-09-21 ENCOUNTER — Other Ambulatory Visit: Payer: Self-pay

## 2020-09-21 ENCOUNTER — Ambulatory Visit (INDEPENDENT_AMBULATORY_CARE_PROVIDER_SITE_OTHER): Payer: No Typology Code available for payment source | Admitting: Orthopedic Surgery

## 2020-09-21 VITALS — BP 189/122 | HR 84 | Ht 69.0 in | Wt 195.0 lb

## 2020-09-21 DIAGNOSIS — S9032XD Contusion of left foot, subsequent encounter: Secondary | ICD-10-CM | POA: Diagnosis not present

## 2020-09-21 DIAGNOSIS — G90522 Complex regional pain syndrome I of left lower limb: Secondary | ICD-10-CM

## 2020-09-21 MED ORDER — GABAPENTIN 100 MG PO CAPS: 100.0000 mg | ORAL_CAPSULE | Freq: Two times a day (BID) | ORAL | 2 refills | Status: DC

## 2020-09-21 NOTE — Progress Notes (Signed)
Chief Complaint  Patient presents with  . Foot Pain    left    Encounter Diagnoses  Name Primary?  . Complex regional pain syndrome type 1 of left lower extremity Yes  . Contusion of left foot, subsequent encounter      54 year old male status post crush injury left foot March 12 of this year injured at work  Currently on clonidine gabapentin and ibuprofen for complex regional pain syndrome  He did get his orthotics and a new shoe with a wider toe box and now is able to ambulate with crutches weightbearing as tolerated  He does report that the gabapentin 300 mg is causing him to be too sleepy when he wakes up in the morning  Shoes seem to fit well.  He still has the swelling and the fullness in the bottom of the foot when he is walking but he notes that the orthotics in his shoes feel much better  Recommend decrease his gabapentin to 200 mg at bedtime  Continue his orthotic shoe wear and follow-up in 4 weeks  (It is chronic, prescription management from complication from prescription excessive drowsiness)

## 2020-09-21 NOTE — Patient Instructions (Signed)
Decrease gabapentin to 200 mg   Wear show for 4 weeks then re check

## 2020-10-19 ENCOUNTER — Encounter: Payer: Self-pay | Admitting: Orthopedic Surgery

## 2020-10-19 ENCOUNTER — Ambulatory Visit (INDEPENDENT_AMBULATORY_CARE_PROVIDER_SITE_OTHER): Payer: No Typology Code available for payment source | Admitting: Orthopedic Surgery

## 2020-10-19 ENCOUNTER — Other Ambulatory Visit: Payer: Self-pay

## 2020-10-19 VITALS — Ht 69.0 in | Wt 199.0 lb

## 2020-10-19 DIAGNOSIS — G90522 Complex regional pain syndrome I of left lower limb: Secondary | ICD-10-CM

## 2020-10-19 DIAGNOSIS — S9032XD Contusion of left foot, subsequent encounter: Secondary | ICD-10-CM | POA: Diagnosis not present

## 2020-10-19 NOTE — Progress Notes (Signed)
Chief Complaint  Patient presents with  . Foot Pain    Lt foot pain   Encounter Diagnoses  Name Primary?  . Complex regional pain syndrome type 1 of left lower extremity Yes  . Contusion of left foot, subsequent encounter     Status post crush injury left foot patient has residual deficits from complex regional pain syndrome he has hypersensitivity to the dorsum of the foot as well as the plantar aspect of the foot he has intermittent swelling at the end of the day which responds to elevation.  He does not tolerate measured prescription stockings secondary to the irritation  There is an area on the dorsum of the foot where the contusion occurred in the crush injury occurred that I like to inject he agrees  We injected 40 mg of Depo-Medrol and 3 cc 1% lidocaine in this area to hopefully get some effect we did warn him about possible atrophy  He already has a special orthotic which is somewhat uncomfortable but seems to be improving in terms of his ability to walk with it.  He still using a cane  No work follow-up in a month

## 2020-10-19 NOTE — Patient Instructions (Signed)

## 2020-11-20 ENCOUNTER — Ambulatory Visit: Payer: No Typology Code available for payment source | Admitting: Orthopedic Surgery

## 2020-11-27 ENCOUNTER — Encounter: Payer: Self-pay | Admitting: Orthopedic Surgery

## 2020-11-27 ENCOUNTER — Ambulatory Visit (INDEPENDENT_AMBULATORY_CARE_PROVIDER_SITE_OTHER): Payer: No Typology Code available for payment source | Admitting: Orthopedic Surgery

## 2020-11-27 ENCOUNTER — Other Ambulatory Visit: Payer: Self-pay

## 2020-11-27 VITALS — BP 177/119 | HR 99 | Ht 69.0 in | Wt 199.0 lb

## 2020-11-27 DIAGNOSIS — S9032XD Contusion of left foot, subsequent encounter: Secondary | ICD-10-CM

## 2020-11-27 DIAGNOSIS — G90522 Complex regional pain syndrome I of left lower limb: Secondary | ICD-10-CM

## 2020-11-27 NOTE — Patient Instructions (Signed)
No work 1 month continue

## 2020-11-27 NOTE — Progress Notes (Signed)
Chief Complaint  Patient presents with  . Foot Injury    Left/ still painful     Encounter Diagnoses  Name Primary?  . Complex regional pain syndrome type 1 of left lower extremity Yes  . Contusion of left foot, subsequent encounter    Work-related injury crush injury left foot  Patient currently in accommodative shoe.  Also in foot orthotic.  His foot looks better skin is wrinkling much better he has less sensitivity on the bottom of the foot, does complain of some pain over the great toe and the area of contusion and crush injury still very sensitive.  He gets some tingling at night and swelling after standing on his feet for 4 to 5 hours  Recommend he continue with the gabapentin to control the tingling.  Continue with his accommodative shoe as well as the foot orthotic  He has excellent range of motion in the great toe appears to have a dorsal spur.  Suspect the enclosed shoe is probably putting pressure in this area  Follow-up in 4 weeks continue current work status  Chronic not stable, low risk of morbidity from additional treatment

## 2020-11-30 ENCOUNTER — Telehealth: Payer: Self-pay | Admitting: Orthopedic Surgery

## 2020-11-30 NOTE — Telephone Encounter (Signed)
Patient asked about cream that Dr Romeo Apple discussed at 11/27/20 office visit - said that [facility] did not have prescription; is it over the counter?

## 2020-11-30 NOTE — Telephone Encounter (Signed)
Voltaren gel 4x per day otc

## 2020-11-30 NOTE — Telephone Encounter (Signed)
Thanks I called him to advise. Left message.  

## 2020-12-25 ENCOUNTER — Other Ambulatory Visit: Payer: Self-pay

## 2020-12-25 ENCOUNTER — Ambulatory Visit (INDEPENDENT_AMBULATORY_CARE_PROVIDER_SITE_OTHER): Payer: No Typology Code available for payment source | Admitting: Orthopedic Surgery

## 2020-12-25 ENCOUNTER — Encounter: Payer: Self-pay | Admitting: Orthopedic Surgery

## 2020-12-25 VITALS — BP 174/119 | HR 78 | Ht 67.0 in | Wt 204.4 lb

## 2020-12-25 DIAGNOSIS — G90522 Complex regional pain syndrome I of left lower limb: Secondary | ICD-10-CM

## 2020-12-25 DIAGNOSIS — S9032XD Contusion of left foot, subsequent encounter: Secondary | ICD-10-CM | POA: Diagnosis not present

## 2020-12-25 MED ORDER — PREGABALIN 50 MG PO CAPS: 50.0000 mg | ORAL_CAPSULE | Freq: Every evening | ORAL | 2 refills | Status: DC

## 2020-12-25 NOTE — Patient Instructions (Addendum)
Start lyrica    Stop gabapentin    Smoking Tobacco Information, Adult Smoking tobacco can be harmful to your health. Tobacco contains a poisonous (toxic), colorless chemical called nicotine. Nicotine is addictive. It changes the brain and can make it hard to stop smoking. Tobacco also has other toxic chemicals that can hurt your body and raise your risk of many cancers. How can smoking tobacco affect me? Smoking tobacco puts you at risk for:  Cancer. Smoking is most commonly associated with lung cancer, but can also lead to cancer in other parts of the body.  Chronic obstructive pulmonary disease (COPD). This is a long-term lung condition that makes it hard to breathe. It also gets worse over time.  High blood pressure (hypertension), heart disease, stroke, or heart attack.  Lung infections, such as pneumonia.  Cataracts. This is when the lenses in the eyes become clouded.  Digestive problems. This may include peptic ulcers, heartburn, and gastroesophageal reflux disease (GERD).  Oral health problems, such as gum disease and tooth loss.  Loss of taste and smell. Smoking can affect your appearance by causing:  Wrinkles.  Yellow or stained teeth, fingers, and fingernails. Smoking tobacco can also affect your social life, because:  It may be challenging to find places to smoke when away from home. Many workplaces, [employer], hotels, and public places are tobacco-free.  Smoking is expensive. This is due to the cost of tobacco and the long-term costs of treating health problems from smoking.  Secondhand smoke may affect those around you. Secondhand smoke can cause lung cancer, breathing problems, and heart disease. Children of smokers have a higher risk for: ? Sudden infant death syndrome (SIDS). ? Ear infections. ? Lung infections. If you currently smoke tobacco, quitting now can help you:  Lead a longer and healthier life.  Look, smell, breathe, and feel better over  time.  Save money.  Protect others from the harms of secondhand smoke. What actions can I take to prevent health problems? Quit smoking  Do not start smoking. Quit if you already do.  Make a plan to quit smoking and commit to it. Look for programs to help you and ask your health care provider for recommendations and ideas.  Set a date and write down all the reasons you want to quit.  Let your friends and family know you are quitting so they can help and support you. Consider finding friends who also want to quit. It can be easier to quit with someone else, so that you can support each other.  Talk with your health care provider about using nicotine replacement medicines to help you quit, such as gum, lozenges, patches, sprays, or pills.  Do not replace cigarette smoking with electronic cigarettes, which are commonly called e-cigarettes. The safety of e-cigarettes is not known, and some may contain harmful chemicals.  If you try to quit but return to smoking, stay positive. It is common to slip up when you first quit, so take it one day at a time.  Be prepared for cravings. When you feel the urge to smoke, chew gum or suck on hard candy.   Lifestyle  Stay busy and take care of your body.  Drink enough fluid to keep your urine pale yellow.  Get plenty of exercise and eat a healthy diet. This can help prevent weight gain after quitting.  Monitor your eating habits. Quitting smoking can cause you to have a larger appetite than when you smoke.  Find ways to relax. Go  out with friends or family to a movie or a restaurant where people do not smoke.  Ask your health care provider about having regular tests (screenings) to check for cancer. This may include blood tests, imaging tests, and other tests.  Find ways to manage your stress, such as meditation, yoga, or exercise. Where to find support To get support to quit smoking, consider:  Asking your health care provider for more  information and resources.  Taking classes to learn more about quitting smoking.  Looking for local organizations that offer resources about quitting smoking.  Joining a support group for people who want to quit smoking in your local community.  Calling the smokefree.gov counselor helpline: 1-800-Quit-Now (262)768-7815) Where to find more information You may find more information about quitting smoking from:  HelpGuide.org: www.helpguide.org  Bank[employer]: smokefree.gov  American Lung Association: www.lung.org Contact a health care provider if you:  Have problems breathing.  Notice that your lips, nose, or fingers turn blue.  Have chest pain.  Are coughing up blood.  Feel faint or you pass out.  Have other health changes that cause you to worry. Summary  Smoking tobacco can negatively affect your health, the health of those around you, your finances, and your social life.  Do not start smoking. Quit if you already do. If you need help quitting, ask your health care provider.  Think about joining a support group for people who want to quit smoking in your local community. There are many effective programs that will help you to quit this behavior. This information is not intended to replace advice given to you by your health care provider. Make sure you discuss any questions you have with your health care provider. Document Revised: 07/30/2019 Document Reviewed: 11/19/2016 Elsevier Patient Education  2021 [employer].

## 2020-12-25 NOTE — Progress Notes (Addendum)
Chief Complaint  Patient presents with  . Foot Pain    L/hurting more for the last couple of months.   55 yo male with chronic pain left foot   S/p crush injury at work   Routine f/u each month   William Chapman is reporting increased pain since I saw him last month.  Pain is in the same place over the dorsum of the foot near the second and third metatarsal heads  Examination shows hypersensitivity in this area mild hypersensitivity on the plantar aspect of the same area no callus formation  Recommend switch from gabapentin to Lyrica  Follow-up 1 month  Addendum noted on January 03, 2021 this was omitted in the last office visit  Patient is out of work x6 months

## 2021-01-02 ENCOUNTER — Telehealth: Payer: Self-pay | Admitting: Orthopedic Surgery

## 2021-01-02 NOTE — Telephone Encounter (Addendum)
Per office note, this has now been added to the 2//22 note  Clarke was not given an out of work note when he came on 12/25/20.  There is no mention of work status in his note. His WC adjustor wants to know if he is still out of work until his next appointment on 01/22/21?  Please advise so this information can be sent to his adjustor Victorino Dike, phone # [phone] and fax # [phone]  Thanks

## 2021-01-05 ENCOUNTER — Encounter: Payer: Self-pay | Admitting: Orthopedic Surgery

## 2021-01-22 ENCOUNTER — Other Ambulatory Visit: Payer: Self-pay

## 2021-01-22 ENCOUNTER — Encounter: Payer: Self-pay | Admitting: Orthopedic Surgery

## 2021-01-22 ENCOUNTER — Ambulatory Visit (INDEPENDENT_AMBULATORY_CARE_PROVIDER_SITE_OTHER): Payer: No Typology Code available for payment source | Admitting: Orthopedic Surgery

## 2021-01-22 VITALS — BP 176/129 | HR 82 | Ht 68.0 in | Wt 205.0 lb

## 2021-01-22 DIAGNOSIS — S9032XD Contusion of left foot, subsequent encounter: Secondary | ICD-10-CM | POA: Diagnosis not present

## 2021-01-22 DIAGNOSIS — G90522 Complex regional pain syndrome I of left lower limb: Secondary | ICD-10-CM

## 2021-01-22 NOTE — Patient Instructions (Signed)

## 2021-01-22 NOTE — Progress Notes (Signed)
Chief Complaint  Patient presents with  . Foot Pain    L/ hurting some pain on the bottom/but hurts the worse on top    55 year old male status post crush injury left foot approximately a year ago following up with me each month.  Complains of pain on the dorsum and plantar aspect of the foot with intermittent sharp pain radiating through the foot constant swelling and trouble standing and walking more than 3 hours and 30 minutes respectively  He is currently on Lyrica ibuprofen and clonidine for complex regional pain syndrome  He says he is not getting any better and he is basically reached his maximal medical improvement and will need permanent restrictions which will include handicap parking  Special shoe with insert for his work shoe  Limited to 3 hours of standing and no more than 30 minutes of continuous walking and no lifting  Continue current medications as stated follow-up in 1 month  MMI is January 22, 2021  Examination of the foot shows swelling hyperemia tenderness more dorsal than proximal otherwise normal alignment normal range of motion of the ankle   Encounter Diagnoses  Name Primary?  . Complex regional pain syndrome type 1 of left lower extremity Yes  . Contusion of left foot, subsequent encounter     Return to work date anytime after March 7 with those restrictions and accommodations Expect to see him in a month

## 2021-02-22 ENCOUNTER — Ambulatory Visit (INDEPENDENT_AMBULATORY_CARE_PROVIDER_SITE_OTHER): Payer: No Typology Code available for payment source | Admitting: Orthopedic Surgery

## 2021-02-22 ENCOUNTER — Other Ambulatory Visit: Payer: Self-pay

## 2021-02-22 ENCOUNTER — Other Ambulatory Visit (HOSPITAL_COMMUNITY): Payer: Self-pay | Admitting: Family Medicine

## 2021-02-22 ENCOUNTER — Encounter: Payer: Self-pay | Admitting: Orthopedic Surgery

## 2021-02-22 VITALS — BP 185/110 | HR 88 | Ht 68.0 in | Wt 205.0 lb

## 2021-02-22 DIAGNOSIS — S9032XD Contusion of left foot, subsequent encounter: Secondary | ICD-10-CM | POA: Diagnosis not present

## 2021-02-22 DIAGNOSIS — N63 Unspecified lump in unspecified breast: Secondary | ICD-10-CM

## 2021-02-22 DIAGNOSIS — G90522 Complex regional pain syndrome I of left lower limb: Secondary | ICD-10-CM

## 2021-02-22 NOTE — Progress Notes (Signed)
Chief Complaint  Patient presents with  . Foot Injury    01/28/20 left foot     William Chapman continues to complain of severe pain in his left foot, he still has evidence of complex regional pain syndrome with discoloration coolness hypersensitivity and altered sensation in the dorsum of the left foot he still using a cane and a supportive shoe with an insert  No changes in his condition  As noted last time these were recommendations Special shoe with insert for his work shoe   Limited to 3 hours of standing and no more than 30 minutes of continuous walking and no lifting   Continue current medications as stated follow-up in 1 month   MMI is January 22, 2021   Examination of the foot shows swelling hyperemia tenderness more dorsal than proximal otherwise normal alignment normal range of motion of the ankle

## 2021-03-06 ENCOUNTER — Other Ambulatory Visit: Payer: Self-pay

## 2021-03-06 ENCOUNTER — Ambulatory Visit (HOSPITAL_COMMUNITY)
Admission: RE | Admit: 2021-03-06 | Discharge: 2021-03-06 | Disposition: A | Discharge status: Home / Self Care | Payer: 59 | Source: Ambulatory Visit | Attending: Family Medicine | Admitting: Family Medicine

## 2021-03-06 DIAGNOSIS — N63 Unspecified lump in unspecified breast: Secondary | ICD-10-CM | POA: Insufficient documentation

## 2021-03-21 ENCOUNTER — Encounter: Payer: Self-pay | Admitting: Orthopedic Surgery

## 2021-03-21 ENCOUNTER — Other Ambulatory Visit: Payer: Self-pay

## 2021-03-21 ENCOUNTER — Ambulatory Visit (INDEPENDENT_AMBULATORY_CARE_PROVIDER_SITE_OTHER): Payer: No Typology Code available for payment source | Admitting: Orthopedic Surgery

## 2021-03-21 VITALS — BP 154/107 | HR 86 | Ht 68.0 in

## 2021-03-21 DIAGNOSIS — S9032XD Contusion of left foot, subsequent encounter: Secondary | ICD-10-CM | POA: Diagnosis not present

## 2021-03-21 DIAGNOSIS — G90522 Complex regional pain syndrome I of left lower limb: Secondary | ICD-10-CM

## 2021-03-21 MED ORDER — PREGABALIN 50 MG PO CAPS: 50.0000 mg | ORAL_CAPSULE | Freq: Two times a day (BID) | ORAL | 2 refills | Status: DC

## 2021-03-21 NOTE — Patient Instructions (Signed)
Take ibuprofen and lyrica (the lyrica is now 2 x  A day

## 2021-03-21 NOTE — Progress Notes (Signed)
Chief Complaint  Patient presents with  . Foot Pain    Left lower extremity, Patient reports the same and more painful today, He feels the pain will ease for a short time and come right back.    Encounter Diagnoses  Name Primary?  . Complex regional pain syndrome type 1 of left lower extremity Yes  . Contusion of left foot, subsequent encounter      Current Outpatient Medications:  .  ibuprofen (ADVIL) 800 MG tablet, Take 1 tablet (800 mg total) by mouth every 8 (eight) hours as needed., Disp: 90 tablet, Rfl: 1 .  lisinopril (ZESTRIL) 40 MG tablet, Take 40 mg by mouth daily., Disp: , Rfl:  .  thiamine 100 MG tablet, Take 1 tablet (100 mg total) by mouth daily., Disp: 30 tablet, Rfl: 0 .  venlafaxine XR (EFFEXOR-XR) 150 MG 24 hr capsule, Take 150 mg by mouth daily with breakfast. , Disp: , Rfl:  .  pregabalin (LYRICA) 50 MG capsule, Take 1 capsule (50 mg total) by mouth 2 (two) times daily., Disp: 90 capsule, Rfl: 32   55 year old male chronic pain left foot after a contusion from a heavy object falling on the foot developed complex regional pain syndrome currently on 50 mg gabapentin at night and ibuprofen  He says that his foot hurts more at times although it is short-lived  He is able to tolerate the 50 mg of Lyrica at night.  I recommend we increase the Lyrica to twice a day  No other changes  Encounter Diagnoses  Name Primary?  . Complex regional pain syndrome type 1 of left lower extremity Yes  . 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  On 12 March she had a large load of metal she dropped on his foot.  He was wearing a work IT consultant.  The load was heavy.  Since he is been to urgent care 3 times had 2 x-rays x-rays were negative he was thought to have cellulitis he was put on antibiotics he is here with continued swelling  intermittent pain which comes and goes is worse at night associated with throbbing and redness and discoloration to the top of the foot near the distal ends of the metatarsals and he cannot get a shoe on

## 2021-04-19 ENCOUNTER — Other Ambulatory Visit: Payer: Self-pay

## 2021-04-19 ENCOUNTER — Encounter: Payer: Self-pay | Admitting: Orthopedic Surgery

## 2021-04-19 ENCOUNTER — Ambulatory Visit: Payer: 59 | Admitting: Orthopedic Surgery

## 2021-04-19 VITALS — BP 162/91 | HR 85 | Ht 68.0 in | Wt 205.0 lb

## 2021-04-19 DIAGNOSIS — S9032XS Contusion of left foot, sequela: Secondary | ICD-10-CM

## 2021-04-19 DIAGNOSIS — G90522 Complex regional pain syndrome I of left lower limb: Secondary | ICD-10-CM | POA: Diagnosis not present

## 2021-04-19 NOTE — Patient Instructions (Signed)
Stop lyrica.

## 2021-04-19 NOTE — Progress Notes (Signed)
Follow-up visit  William Chapman is here for his routine follow-up.  His case was settled.  He remembers receiving a 10% disability rating related to his left foot  His current symptoms include numbness and tingling for discoloration and limp favoring left lower extremity  He does not think the Lyrica is helping  There was a discrepancy regarding whether he should have orthotic or plate placed in the shoe when I am comfortable remove the plate as that seemed to help  We did give him a handicap permanent placard form.  He will follow-up with me in 4 months.  Encounter Diagnoses  Name Primary?  . Complex regional pain syndrome type 1 of left lower extremity Yes  . Contusion of left foot, sequela

## 2021-08-20 ENCOUNTER — Other Ambulatory Visit: Payer: Self-pay

## 2021-08-20 ENCOUNTER — Encounter: Payer: Self-pay | Admitting: Orthopedic Surgery

## 2021-08-20 ENCOUNTER — Ambulatory Visit (INDEPENDENT_AMBULATORY_CARE_PROVIDER_SITE_OTHER): Payer: 59 | Admitting: Orthopedic Surgery

## 2021-08-20 VITALS — BP 164/110 | HR 89 | Ht 68.0 in | Wt 200.0 lb

## 2021-08-20 DIAGNOSIS — S9032XD Contusion of left foot, subsequent encounter: Secondary | ICD-10-CM | POA: Diagnosis not present

## 2021-08-20 DIAGNOSIS — G90522 Complex regional pain syndrome I of left lower limb: Secondary | ICD-10-CM

## 2021-08-20 NOTE — Progress Notes (Signed)
Chief Complaint  Patient presents with   Foot Pain    March 2021 left foot     Encounter Diagnoses  Name Primary?   Complex regional pain syndrome type 1 of left lower extremity Yes   Contusion of left foot, subsequent encounter     William Chapman comes in for routine follow-up he injured his foot back in 2021 on March 13 severe contusion which developed into complex regional pain syndrome  He complains of hypersensitivity to his foot discoloration intermittent cool feeling  He failed a trial of clonidine he still on Lyrica and ibuprofen  He also had difficulty taking Neurontin  No real changes in his condition at this time  Is exam shows hypersensitivity over the dorsum of the foot still has swelling in the area of the contusion he has decreased motion of his fifth digit of his left foot the other toes seem to move reasonably well.  Overall alignment remains normal  He still ambulating with a cane  Recommend continue with ibuprofen and Lyrica  Follow-up in 3 months  At this point no other treatments to offer.  Please see prior notes

## 2021-08-20 NOTE — Patient Instructions (Signed)
Continue lyrica and ibuprofen.

## 2021-11-22 ENCOUNTER — Ambulatory Visit: Payer: 59 | Admitting: Orthopedic Surgery

## 2021-12-10 ENCOUNTER — Encounter: Payer: Self-pay | Admitting: Orthopedic Surgery

## 2021-12-10 ENCOUNTER — Other Ambulatory Visit: Payer: Self-pay

## 2021-12-10 ENCOUNTER — Ambulatory Visit (INDEPENDENT_AMBULATORY_CARE_PROVIDER_SITE_OTHER): Payer: Self-pay | Admitting: Orthopedic Surgery

## 2021-12-10 DIAGNOSIS — M10071 Idiopathic gout, right ankle and foot: Secondary | ICD-10-CM

## 2021-12-10 DIAGNOSIS — G90522 Complex regional pain syndrome I of left lower limb: Secondary | ICD-10-CM

## 2021-12-10 MED ORDER — COLCHICINE 0.6 MG PO TABS: 0.6000 mg | ORAL_TABLET | Freq: Four times a day (QID) | ORAL | 0 refills | Status: DC

## 2021-12-10 NOTE — Progress Notes (Signed)
In the course of his visit the patient indicates that he has gout in his right foot but it is not responding to allopurinol and Naprosyn  His exam shows a swollen left foot concentrated over the great toe with painful range of motion but some edema tracking back to the ankle as well as erythema.  Recommend he try colchicine and if not better call me back in a month to get a separate appointment from his [employer] appointment

## 2021-12-10 NOTE — Progress Notes (Signed)
Chief Complaint  Patient presents with   Foot Pain    Left/ same    William Chapman comes in for his 43-month follow-up he has complex regional pain syndrome of the left lower extremity secondary to can fusion from heavy object falling on the left foot 2021  He complains of continued hypersensitivities to the left foot with discoloration and intermittent temperature changes.  He failed a trial of clonidine still on Lyrica and ibuprofen and had difficulty taking gabapentin.  No changes in his condition  His exam still shows the hypersensitivity tenderness on the dorsum of the foot some decreased sensation in some of the toes overall alignment is normal  He still ambulates with a cane  Recommend he continue ibuprofen and Lyrica follow-up in 99-month's

## 2022-01-27 ENCOUNTER — Other Ambulatory Visit: Payer: Self-pay

## 2022-01-27 ENCOUNTER — Ambulatory Visit
Admission: EM | Admit: 2022-01-27 | Discharge: 2022-01-27 | Disposition: A | Discharge status: Home / Self Care | Payer: Self-pay | Attending: Family Medicine | Admitting: Family Medicine

## 2022-01-27 DIAGNOSIS — J3089 Other allergic rhinitis: Secondary | ICD-10-CM

## 2022-01-27 DIAGNOSIS — Z20828 Contact with and (suspected) exposure to other viral communicable diseases: Secondary | ICD-10-CM

## 2022-01-27 DIAGNOSIS — R062 Wheezing: Secondary | ICD-10-CM

## 2022-01-27 DIAGNOSIS — J0100 Acute maxillary sinusitis, unspecified: Secondary | ICD-10-CM

## 2022-01-27 DIAGNOSIS — R509 Fever, unspecified: Secondary | ICD-10-CM

## 2022-01-27 MED ORDER — ALBUTEROL SULFATE HFA 108 (90 BASE) MCG/ACT IN AERS: 1.0000 | INHALATION_SPRAY | Freq: Four times a day (QID) | RESPIRATORY_TRACT | 0 refills | Status: DC | PRN

## 2022-01-27 MED ORDER — AMOXICILLIN-POT CLAVULANATE 875-125 MG PO TABS: 1.0000 | ORAL_TABLET | Freq: Two times a day (BID) | ORAL | 0 refills | Status: DC

## 2022-01-27 MED ORDER — PROMETHAZINE-DM 6.25-15 MG/5ML PO SYRP: 5.0000 mL | ORAL_SOLUTION | Freq: Four times a day (QID) | ORAL | 0 refills | Status: DC | PRN

## 2022-01-27 MED ORDER — PREDNISONE 20 MG PO TABS: 40.0000 mg | ORAL_TABLET | Freq: Every day | ORAL | 0 refills | Status: DC

## 2022-01-27 NOTE — ED Provider Notes (Signed)
?[facility] ? ? ? ?CSN: [id number] ?Arrival date & time: 01/27/22  0957 ? ? ?  ? ?History   ?Chief Complaint ?Chief Complaint  ?Patient presents with  ? Fever  ?  Congestion, fever and headache  ? ? ?HPI ?William Chapman is a 56 y.o. male.  ? ?Presenting today with 1-1/2 weeks of progressively worsening congestion, sore throat, productive cough, chest tightness, fatigue.  States he was feeling somewhat better but then yesterday got significantly worse again and now having fever, chills, sinus pain and pressure, difficulty breathing.  Denies abdominal pain, nausea, vomiting, diarrhea, rashes.  Taking numerous over-the-counter cold and congestion medications with no relief. ? ? ?History reviewed. No pertinent past medical history. ? ?Patient Active Problem List  ? Diagnosis Date Noted  ? Contusion of left foot 07/26/2020  ? Alcohol abuse with intoxication/BAL 229 08/20/2018  ? Depression 08/20/2018  ? Syncope and collapse 08/19/2018  ? ? ?History reviewed. No pertinent surgical history. ? ? ? ? ?Home Medications   ? ?Prior to Admission medications   ?Medication Sig Start Date End Date Taking? Authorizing Provider  ?albuterol (VENTOLIN HFA) 108 (90 Base) MCG/ACT inhaler Inhale 1-2 puffs into the lungs every 6 (six) hours as needed for wheezing or shortness of breath. 01/27/22  Yes Particia NearingLane, Carrieanne Kleen Elizabeth, PA-C  ?amoxicillin-clavulanate (AUGMENTIN) 875-125 MG tablet Take 1 tablet by mouth every 12 (twelve) hours. 01/27/22  Yes Particia NearingLane, Versia Mignogna Elizabeth, PA-C  ?predniSONE (DELTASONE) 20 MG tablet Take 2 tablets (40 mg total) by mouth daily with breakfast. 01/27/22  Yes Particia NearingLane, Hopie Pellegrin Elizabeth, PA-C  ?promethazine-dextromethorphan (PROMETHAZINE-DM) 6.25-15 MG/5ML syrup Take 5 mLs by mouth 4 (four) times daily as needed. 01/27/22  Yes Particia NearingLane, Kyung Muto Elizabeth, PA-C  ?allopurinol (ZYLOPRIM) 300 MG tablet Take 300 mg by mouth daily. 11/28/21   [provider]  ?colchicine 0.6 MG tablet Take 1 tablet (0.6 mg total) by  mouth every 6 (six) hours. 12/10/21   Vickki HearingHarrison, Stanley E, MD  ?ibuprofen (ADVIL) 800 MG tablet Take 1 tablet (800 mg total) by mouth every 8 (eight) hours as needed. 08/24/20   Vickki HearingHarrison, Stanley E, MD  ?lisinopril (ZESTRIL) 40 MG tablet Take 40 mg by mouth daily. 02/20/21   [provider]  ?naproxen (NAPROSYN) 500 MG tablet Take 500 mg by mouth 2 (two) times daily. 11/27/21   [provider]  ?pregabalin (LYRICA) 50 MG capsule Take 1 capsule (50 mg total) by mouth 2 (two) times daily. 03/21/21   Vickki HearingHarrison, Stanley E, MD  ?thiamine 100 MG tablet Take 1 tablet (100 mg total) by mouth daily. 08/21/18   Edsel PetrinMikhail, Maryann, DO  ?venlafaxine XR (EFFEXOR-XR) 150 MG 24 hr capsule Take 150 mg by mouth daily with breakfast.  07/14/18   [provider]  ? ? ?Family History ?Family History  ?Problem Relation Age of Onset  ? Healthy Mother   ? COPD Father   ? ? ?Social History ?Social History  ? ?Tobacco Use  ? Smoking status: Some Days  ?  Packs/day: 1.00  ?  Types: Cigarettes  ? Smokeless tobacco: Never  ?Vaping Use  ? Vaping Use: Never used  ?Substance Use Topics  ? Alcohol use: Yes  ? Drug use: Not Currently  ? ? ? ?Allergies   ?Patient has no known allergies. ? ? ?Review of Systems ?Review of Systems ?Per HPI ? ?Physical Exam ?Triage Vital Signs ?ED Triage Vitals  ?Enc Vitals Group  ?   BP 01/27/22 1057 133/89  ?  Pulse Rate 01/27/22 1057 97  ?   Resp 01/27/22 1057 18  ?   Temp 01/27/22 1057 (!) 101.2 ?F (38.4 ?C)  ?   Temp Source 01/27/22 1057 Oral  ?   SpO2 01/27/22 1057 96 %  ?   Weight --   ?   Height --   ?   Head Circumference --   ?   Peak Flow --   ?   Pain Score 01/27/22 1054 6  ?   Pain Loc --   ?   Pain Edu? --   ?   Excl. in GC? --   ? ?No data found. ? ?Updated Vital Signs ?BP 133/89 (BP Location: Right Arm)   Pulse 97   Temp (!) 101.2 ?F (38.4 ?C) (Oral)   Resp 18   SpO2 96%  ? ?Visual Acuity ?Right Eye Distance:   ?Left Eye Distance:   ?Bilateral Distance:   ? ?Right Eye Near:   ?Left  Eye Near:    ?Bilateral Near:    ? ?Physical Exam ?Vitals and nursing note reviewed.  ?Constitutional:   ?   Appearance: He is well-developed.  ?HENT:  ?   Head: Atraumatic.  ?   Right Ear: External ear normal.  ?   Left Ear: External ear normal.  ?   Nose: Congestion present.  ?   Mouth/Throat:  ?   Mouth: Mucous membranes are moist.  ?   Pharynx: Posterior oropharyngeal erythema present. No oropharyngeal exudate.  ?Eyes:  ?   Conjunctiva/sclera: Conjunctivae normal.  ?   Pupils: Pupils are equal, round, and reactive to light.  ?Cardiovascular:  ?   Rate and Rhythm: Normal rate and regular rhythm.  ?   Heart sounds: Normal heart sounds.  ?Pulmonary:  ?   Effort: Pulmonary effort is normal. No respiratory distress.  ?   Breath sounds: Wheezing present. No rales.  ?Musculoskeletal:     ?   General: Normal range of motion.  ?   Cervical back: Normal range of motion and neck supple.  ?Lymphadenopathy:  ?   Cervical: No cervical adenopathy.  ?Skin: ?   General: Skin is warm and dry.  ?Neurological:  ?   Mental Status: He is alert and oriented to person, place, and time.  ?Psychiatric:     ?   Behavior: Behavior normal.  ? ? ? ?UC Treatments / Results  ?Labs ?(all labs ordered are listed, but only abnormal results are displayed) ?Labs Reviewed  ?COVID-19, FLU A+B NAA  ? ? ?EKG ? ? ?Radiology ?No results found. ? ?Procedures ?Procedures (including critical care time) ? ?Medications Ordered in UC ?Medications - No data to display ? ?Initial Impression / Assessment and Plan / UC Course  ?I have reviewed the triage vital signs and the nursing notes. ? ?Pertinent labs & imaging results that were available during my care of the patient were reviewed by me and considered in my medical decision making (see chart for details). ? ?  ? ?Suspect sinus infection following a viral infection, likely COVID-19.  Also uncontrolled seasonal allergies likely contributing.  He is a former smoker, no diagnosed COPD but having significant  wheezes and chest tightness additionally.  We will treat for possible pulmonary exacerbation with prednisone, sinus infection with Augmentin and Phenergan DM, albuterol for symptomatic benefit.  Discussed good allergy regimen.  Return for acutely worsening symptoms. ? ?Final Clinical Impressions(s) / UC Diagnoses  ? ?Final diagnoses:  ?Exposure to the flu  ?Fever,  unspecified  ?Acute maxillary sinusitis, recurrence not specified  ?Seasonal allergic rhinitis due to other allergic trigger  ?Wheezing  ? ?Discharge Instructions   ?None ?  ? ?ED Prescriptions   ? ? Medication Sig Dispense Auth. Provider  ? amoxicillin-clavulanate (AUGMENTIN) 875-125 MG tablet Take 1 tablet by mouth every 12 (twelve) hours. 14 tablet Particia Nearing, [place]  ? predniSONE (DELTASONE) 20 MG tablet Take 2 tablets (40 mg total) by mouth daily with breakfast. 10 tablet Particia Nearing, PA-C  ? promethazine-dextromethorphan (PROMETHAZINE-DM) 6.25-15 MG/5ML syrup Take 5 mLs by mouth 4 (four) times daily as needed. 100 mL Particia Nearing, [place]  ? albuterol (VENTOLIN HFA) 108 (90 Base) MCG/ACT inhaler Inhale 1-2 puffs into the lungs every 6 (six) hours as needed for wheezing or shortness of breath. 18 g Particia Nearing, [place]  ? ?  ? ?PDMP not reviewed this encounter. ?  ?Particia Nearing, PA-C ?01/27/22 1439 ? ?

## 2022-01-27 NOTE — ED Triage Notes (Signed)
Pt states that last Thursday he started having congestion in his throat and then it progressed to his head and chest ? ?Pt states that he ran a fever of 102.0 on Saturday ? ?Pt started felling better but yesterday he started having chills and a fever again ? ?Pt states that he has a runny nose and coughing up mucus and feeling fatigue ?

## 2022-01-28 LAB — COVID-19, FLU A+B NAA
Influenza A, NAA: NOT DETECTED
Influenza B, NAA: NOT DETECTED
SARS-CoV-2, NAA: NOT DETECTED

## 2022-03-11 ENCOUNTER — Ambulatory Visit: Payer: Self-pay | Admitting: Orthopedic Surgery

## 2022-03-11 DIAGNOSIS — M109 Gout, unspecified: Secondary | ICD-10-CM

## 2022-03-11 DIAGNOSIS — G90522 Complex regional pain syndrome I of left lower limb: Secondary | ICD-10-CM

## 2022-03-11 DIAGNOSIS — M25572 Pain in left ankle and joints of left foot: Secondary | ICD-10-CM

## 2022-03-11 NOTE — Patient Instructions (Signed)
Follow up in 6 months for left ankle pain ?

## 2022-03-11 NOTE — Progress Notes (Signed)
FOLLOW UP  ? ?Encounter Diagnosis  ?Name Primary?  ? Complex regional pain syndrome type 1 of left lower extremity Yes  ? ? ? ?Chief Complaint  ?Patient presents with  ? Foot Pain  ?  Due to previous injury in 2021- pain about the same, this past weekend starting having pain in the ankle.  ? ? ? ?William Chapman is here for routine follow-up 3 months intervals.  He had some left ankle pain this weekend not sure why no trauma ? ?His chronic pain in his left foot persist then he does have gout in his right foot confirmed by positive uric acid test he is on allopurinol and colchicine for that ? ?We updated his medication list he was on Naprosyn and ibuprofen we kept him on Naprosyn and stop the ibuprofen he will continue his allopurinol and colchicine for the gout of the right foot ? ?As far as his left foot goes his chronic regional pain syndrome symptoms persist ? ?He has some new acute ankle pain with no physical findings of any trauma or abnormality ? ?His left ankle exam and left foot exam was as follows ? ?He has a darkened area on the dorsum of the foot from the injury he had at work back in 2021.  That area is still hypersensitive and tender.  He has purpleish hyperemia to his foot consistent with his complex regional pain syndrome and his sensitivity is heightened as well.  He also has some tenderness in the anterolateral ankle negative drawer test some pain with inversion ? ?I suspect he may have rolled his ankle and just did not realize it ? ?No further treatment needed should resolve on its own ? ?Encounter Diagnoses  ?Name Primary?  ? Complex regional pain syndrome type 1 of left lower extremity Yes  ? Acute left ankle pain   ? Gout of right foot, unspecified cause, unspecified chronicity   ? ? ?Follow-up in 6 months ?

## 2022-05-02 ENCOUNTER — Other Ambulatory Visit: Payer: Self-pay | Admitting: Family Medicine

## 2022-05-02 NOTE — Telephone Encounter (Signed)
Requested Prescriptions  Pending Prescriptions Disp Refills  . albuterol (VENTOLIN HFA) 108 (90 Base) MCG/ACT inhaler [Pharmacy Med Name: Albuterol Sulfate HFA 108 (90 Base) MCG/ACT Inhalation Aerosol Solution] 18 g 0    Sig: INHALE 1 TO 2 PUFFS BY MOUTH EVERY 6 HOURS AS NEEDED FOR WHEEZING AND FOR SHORTNESS OF BREATH     There is no refill protocol information for this order

## 2022-09-08 IMAGING — MG DIGITAL DIAGNOSTIC BILAT W/ TOMO W/ CAD
6 of 10 series · 6 of 30 positions shown · non-contrast
Comparison: None.

CLINICAL DATA: Patient presents with tender swelling the breast.

EXAM:
DIGITAL DIAGNOSTIC BILATERAL MAMMOGRAM WITH TOMOSYNTHESIS AND CAD
TECHNIQUE: Bilateral digital diagnostic mammography and breast tomosynthesis
was performed. The images were evaluated with computer-aided
detection.

[R CC synth-2D]
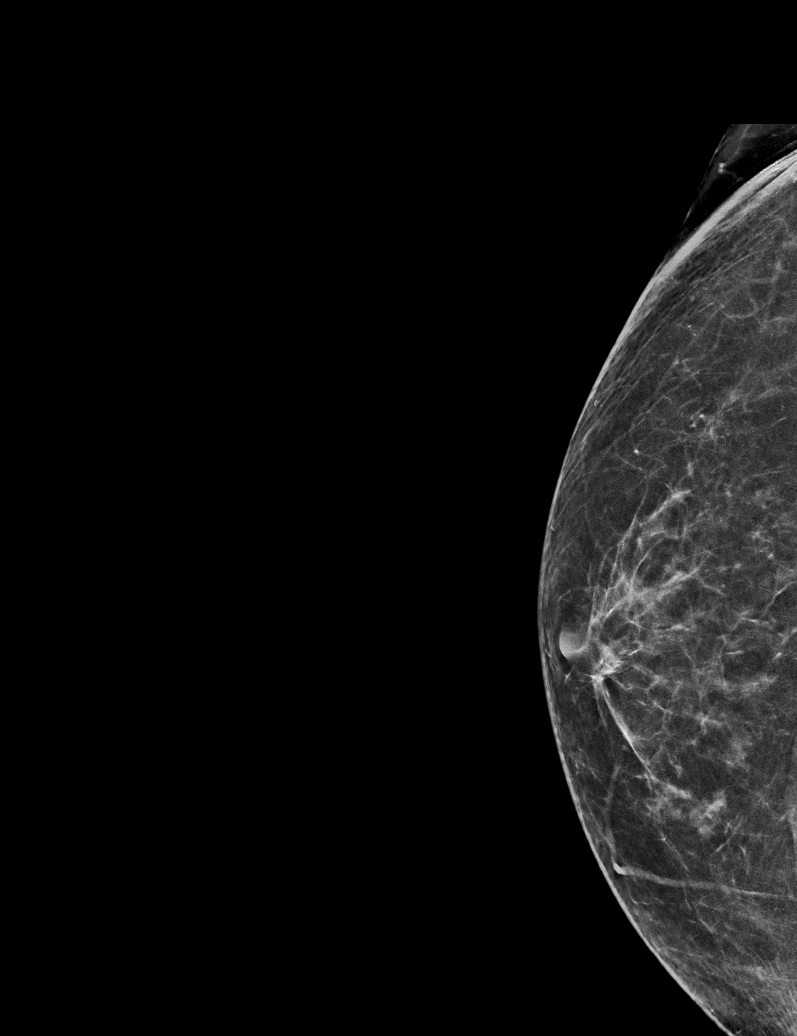

[L MLO synth-2D]
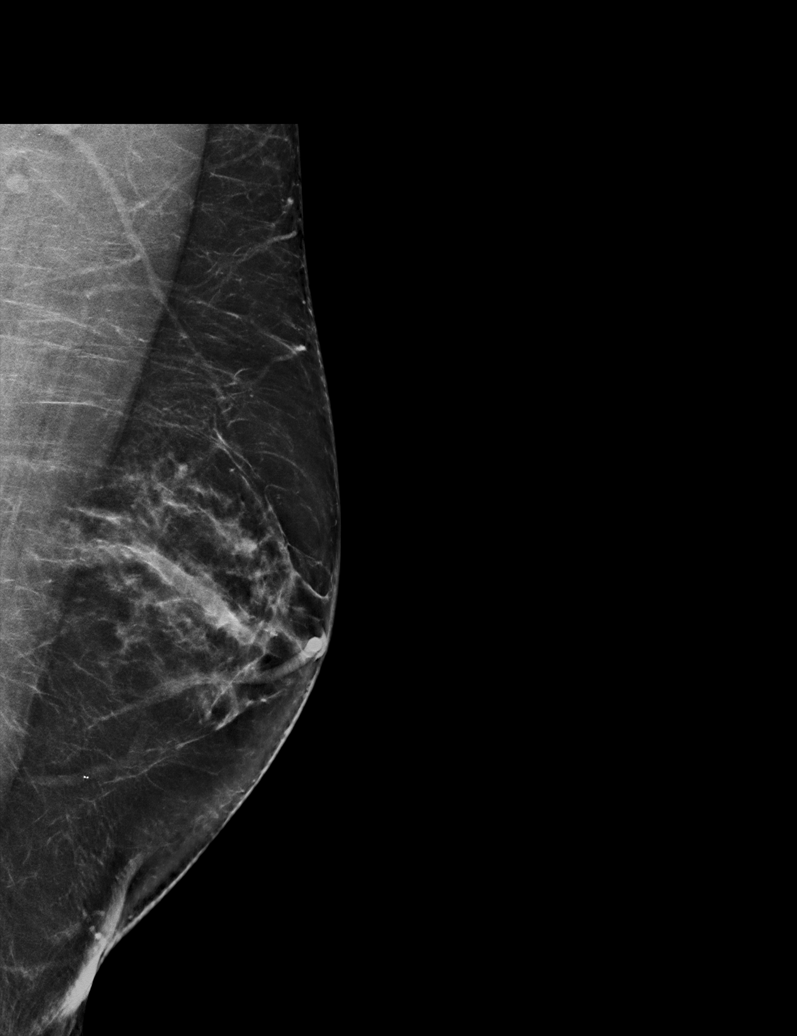

[L CC synth-2D (1 of 2)]
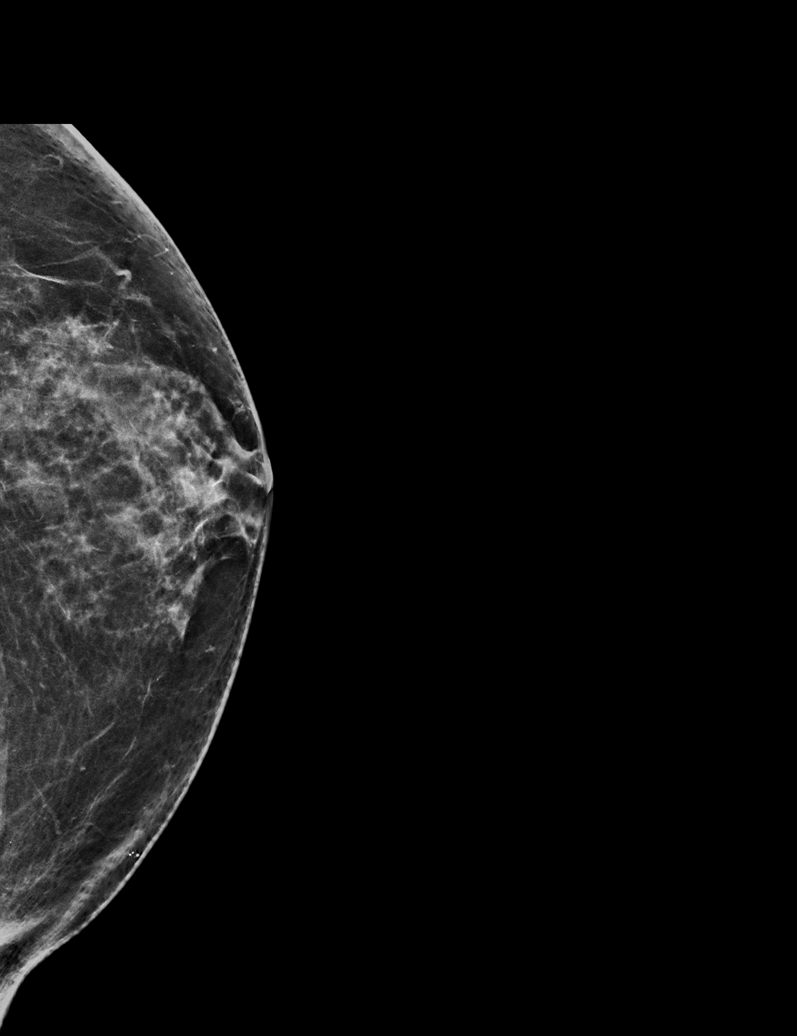

[R MLO synth-2D]
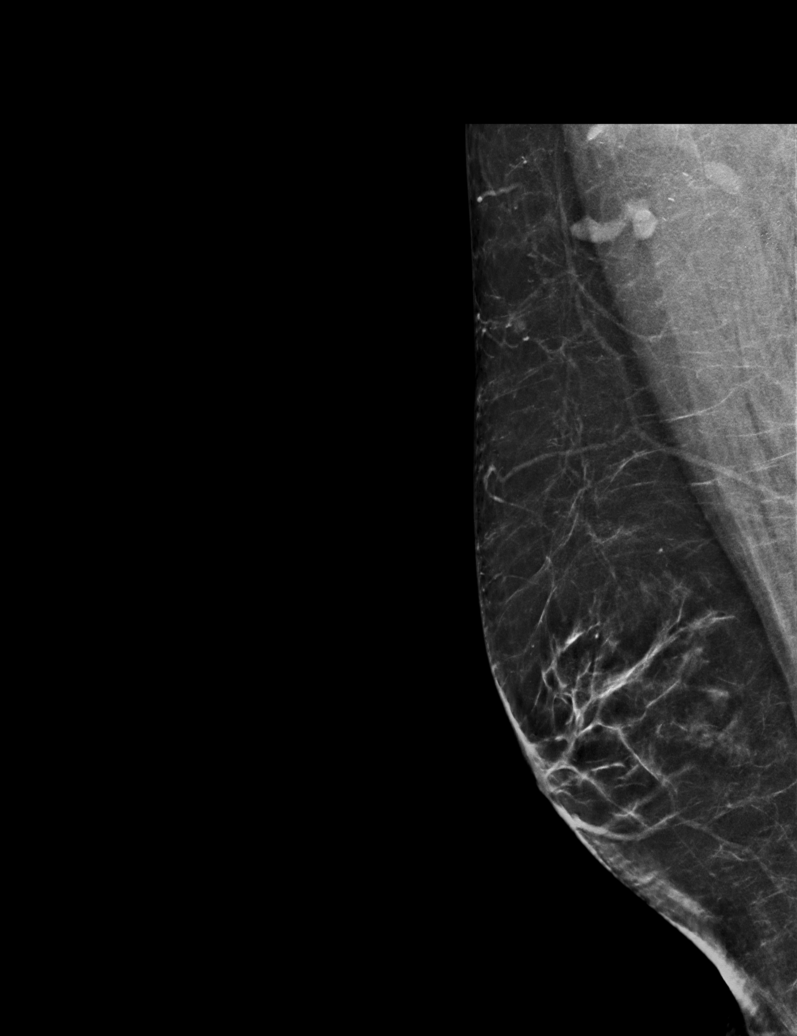

[L CC synth-2D (2 of 2)]
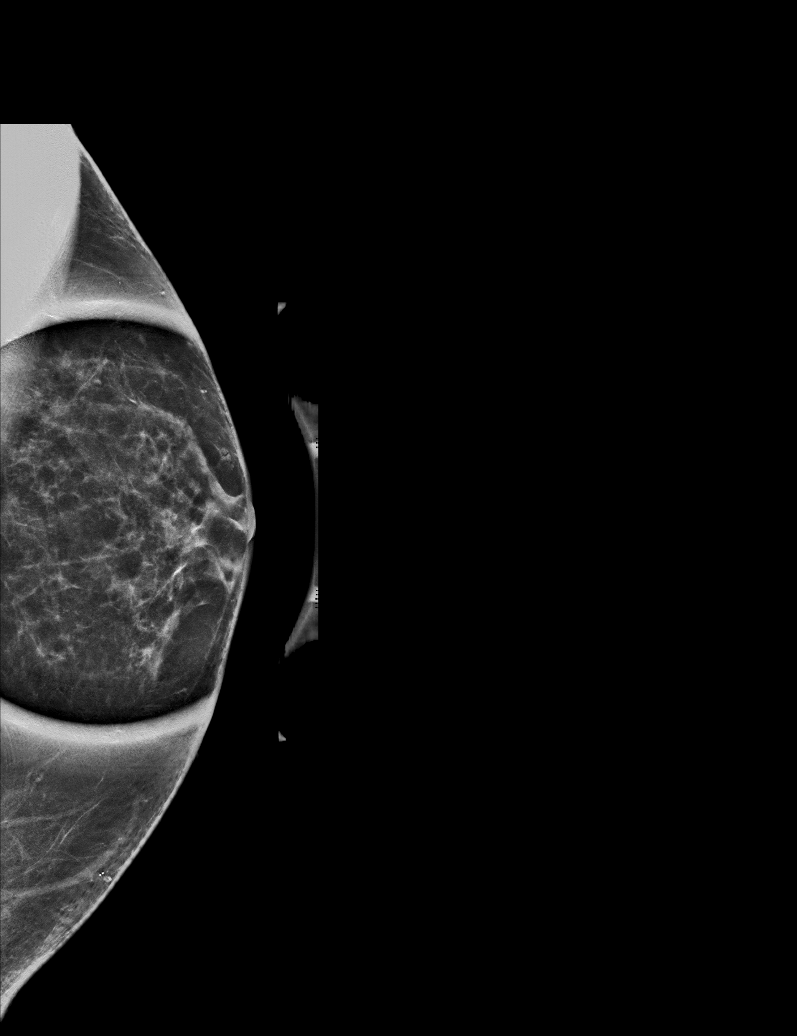

[L CC tomo · tomo slice 31/60.0]
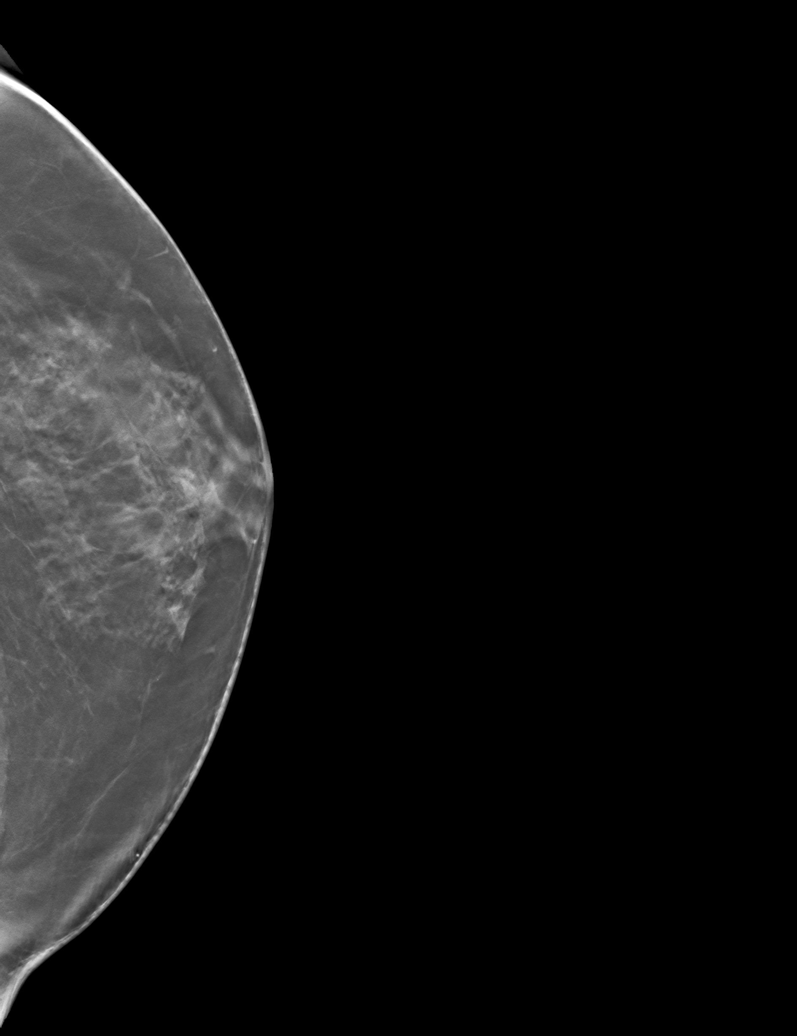

[6 of 30 positions shown; findings below may reference images not displayed]

ACR Breast Density Category b: There are scattered areas of
fibroglandular density.
FINDINGS: In both breasts, left greater than right, there is increased density
centered in the retroareolar breasts a pattern characteristic of
gynecomastia. There are no defined masses, no areas of architectural
distortion and no suspicious calcifications.

On physical examination, is soft retroareolar prominence on the left
without a defined mass. There is mild tenderness to palpation.
IMPRESSION: Benign, left greater than right, gynecomastia. No evidence of breast
malignancy.

RECOMMENDATION:
Clinical follow-up for the benign gynecomastia if symptoms
persist/worsen.

I have discussed the findings and recommendations with the patient.
If applicable, a reminder letter will be sent to the patient
regarding the next appointment.

BI-RADS CATEGORY  2: Benign.

## 2022-09-09 ENCOUNTER — Encounter: Payer: Self-pay | Admitting: Orthopedic Surgery

## 2022-09-09 ENCOUNTER — Ambulatory Visit: Payer: Medicare HMO | Admitting: Orthopedic Surgery

## 2022-09-09 DIAGNOSIS — M109 Gout, unspecified: Secondary | ICD-10-CM | POA: Diagnosis not present

## 2022-09-09 DIAGNOSIS — G90522 Complex regional pain syndrome I of left lower limb: Secondary | ICD-10-CM

## 2022-09-09 DIAGNOSIS — S9032XS Contusion of left foot, sequela: Secondary | ICD-10-CM

## 2022-09-09 NOTE — Progress Notes (Signed)
FOLLOW UP   Encounter Diagnoses  Name Primary?   Complex regional pain syndrome type 1 of left lower extremity Yes   Gout of right foot, unspecified cause, unspecified chronicity    Contusion of left foot, sequela      Chief Complaint  Patient presents with   Foot Injury    01/17/20 left foot contusion      William Chapman had a severe contusion of his right foot back in 2021 while he was at work.  He developed chronic pain in the foot with complex regional pain syndrome and is here for 37-month follow-up.  He is also noted to have a history of gout which flares up during the treatment course  Currently he is having constant pain in his foot with sensitivity over the top where the contusion occurred he has increased pain with weather changes and at night.  He is wearing an orthotic using a cane taking ibuprofen.  He also has pain on the bottom of the foot which has been chronic under the metatarsals  He is ambulatory with a cane in the shoe orthotic  Recommend 29-month follow-up continue ibuprofen  No longer requiring gabapentin.  Medically it is my opinion he cannot return to his previous type of employment

## 2023-01-16 ENCOUNTER — Encounter: Payer: Self-pay | Admitting: Radiology

## 2023-01-29 DIAGNOSIS — D239 Other benign neoplasm of skin, unspecified: Secondary | ICD-10-CM | POA: Diagnosis not present

## 2023-01-29 DIAGNOSIS — D485 Neoplasm of uncertain behavior of skin: Secondary | ICD-10-CM | POA: Diagnosis not present

## 2023-01-29 DIAGNOSIS — D225 Melanocytic nevi of trunk: Secondary | ICD-10-CM | POA: Diagnosis not present

## 2023-01-29 DIAGNOSIS — L570 Actinic keratosis: Secondary | ICD-10-CM | POA: Diagnosis not present

## 2023-02-11 DIAGNOSIS — L570 Actinic keratosis: Secondary | ICD-10-CM | POA: Diagnosis not present

## 2023-03-12 ENCOUNTER — Ambulatory Visit: Payer: Medicare HMO | Admitting: Orthopedic Surgery

## 2023-03-13 ENCOUNTER — Ambulatory Visit (INDEPENDENT_AMBULATORY_CARE_PROVIDER_SITE_OTHER): Payer: No Typology Code available for payment source | Admitting: Orthopedic Surgery

## 2023-03-13 ENCOUNTER — Encounter: Payer: Self-pay | Admitting: Orthopedic Surgery

## 2023-03-13 DIAGNOSIS — G90522 Complex regional pain syndrome I of left lower limb: Secondary | ICD-10-CM | POA: Diagnosis not present

## 2023-03-13 DIAGNOSIS — S9032XD Contusion of left foot, subsequent encounter: Secondary | ICD-10-CM | POA: Diagnosis not present

## 2023-03-13 DIAGNOSIS — S9032XS Contusion of left foot, sequela: Secondary | ICD-10-CM | POA: Diagnosis not present

## 2023-03-13 MED ORDER — PREGABALIN 50 MG PO CAPS: 50.0000 mg | ORAL_CAPSULE | Freq: Two times a day (BID) | ORAL | 2 refills | Status: DC

## 2023-03-13 MED ORDER — IBUPROFEN 800 MG PO TABS: 800.0000 mg | ORAL_TABLET | Freq: Three times a day (TID) | ORAL | 1 refills | Status: DC | PRN

## 2023-03-13 NOTE — Progress Notes (Signed)
   This is a follow-up appointment  Encounter Diagnoses  Name Primary?   Contusion of left foot, sequela Yes   Complex regional pain syndrome type 1 of left lower extremity      Chief Complaint  Patient presents with   Foot Pain    LT foot/ 6 mth follow up Pain is about the same    Saxon comes in today with increased pain and swelling in his left foot.  He is using his cane  He takes ibuprofen 40 mg once a day.  He stopped taking the Lyrica when he ran out of the medication  Examination shows discoloration on the dorsum of his left foot with a purple hue.  He is tender over the dorsum of the foot at the initial site of the injury has decreased range of motion in the metatarsal phalangeal joints of the third fourth and fifth digit  Recommend that he continue his cane, his orthotic which is an issue and resume the Lyrica and the Advil  He will follow-up in 6 months Meds ordered this encounter  Medications   ibuprofen (ADVIL) 800 MG tablet    Sig: Take 1 tablet (800 mg total) by mouth every 8 (eight) hours as needed.    Dispense:  90 tablet    Refill:  1   pregabalin (LYRICA) 50 MG capsule    Sig: Take 1 capsule (50 mg total) by mouth 2 (two) times daily.    Dispense:  90 capsule    Refill:  2   Return 6 months W/C   Not able to return to work   [employer] orthotic

## 2023-07-21 ENCOUNTER — Other Ambulatory Visit: Payer: Self-pay | Admitting: Orthopedic Surgery

## 2023-07-21 DIAGNOSIS — S9032XS Contusion of left foot, sequela: Secondary | ICD-10-CM

## 2023-07-21 DIAGNOSIS — G90522 Complex regional pain syndrome I of left lower limb: Secondary | ICD-10-CM

## 2023-08-11 DIAGNOSIS — L089 Local infection of the skin and subcutaneous tissue, unspecified: Secondary | ICD-10-CM | POA: Diagnosis not present

## 2023-08-11 DIAGNOSIS — D485 Neoplasm of uncertain behavior of skin: Secondary | ICD-10-CM | POA: Diagnosis not present

## 2023-08-11 DIAGNOSIS — L570 Actinic keratosis: Secondary | ICD-10-CM | POA: Diagnosis not present

## 2023-09-08 DIAGNOSIS — R109 Unspecified abdominal pain: Secondary | ICD-10-CM | POA: Diagnosis not present

## 2023-09-08 DIAGNOSIS — J929 Pleural plaque without asbestos: Secondary | ICD-10-CM | POA: Diagnosis not present

## 2023-09-08 DIAGNOSIS — F17210 Nicotine dependence, cigarettes, uncomplicated: Secondary | ICD-10-CM | POA: Diagnosis not present

## 2023-09-08 DIAGNOSIS — K439 Ventral hernia without obstruction or gangrene: Secondary | ICD-10-CM | POA: Diagnosis not present

## 2023-09-08 DIAGNOSIS — I700 Atherosclerosis of aorta: Secondary | ICD-10-CM | POA: Diagnosis not present

## 2023-09-08 DIAGNOSIS — R0602 Shortness of breath: Secondary | ICD-10-CM | POA: Diagnosis not present

## 2023-09-08 DIAGNOSIS — K760 Fatty (change of) liver, not elsewhere classified: Secondary | ICD-10-CM | POA: Diagnosis not present

## 2023-09-12 ENCOUNTER — Ambulatory Visit (INDEPENDENT_AMBULATORY_CARE_PROVIDER_SITE_OTHER): Payer: No Typology Code available for payment source | Admitting: Orthopedic Surgery

## 2023-09-12 ENCOUNTER — Other Ambulatory Visit (INDEPENDENT_AMBULATORY_CARE_PROVIDER_SITE_OTHER): Payer: No Typology Code available for payment source

## 2023-09-12 ENCOUNTER — Encounter: Payer: Self-pay | Admitting: Orthopedic Surgery

## 2023-09-12 VITALS — BP 177/121 | HR 84 | Ht 69.0 in | Wt 215.0 lb

## 2023-09-12 DIAGNOSIS — G90522 Complex regional pain syndrome I of left lower limb: Secondary | ICD-10-CM

## 2023-09-12 DIAGNOSIS — S9032XS Contusion of left foot, sequela: Secondary | ICD-10-CM

## 2023-09-12 NOTE — Patient Instructions (Signed)
We are referring you to [facility] from [facility] address is [address] [place]  The phone number is [phone]  The office will call you with an appointment Dr. Lajoyce Corners  The neurology office will do nerve study but this takes a few months to schedule, you have to see the neurologist first they will also call you, and are in [facility]

## 2023-09-12 NOTE — Progress Notes (Signed)
Follow-up visit  Encounter Diagnoses  Name Primary?   Contusion of left foot, sequela Yes   Complex regional pain syndrome type 1 of left lower extremity     57 year old male had a crush injury to his foot at work was never able to return to normal employment at [employer] home[employer]improve[employer]  Injury date was on January 28, 2020  He is here for follow-up required by [employer] for periodic checkups  His pain is worsening  Chief Complaint  Patient presents with   Foot Pain    Follow up left foot pain  patient says that it is no better he is walking on the ball of his foot.Swelling comes and goes pain is constant and gets worse when up moving to long     He is on Lyrica for presumptive complex regional pain syndrome.  He has a foot insert which is a carbon fiber type insert  He is walking on the side of his foot he seems to be having more numbness and tingling than he usually does  His foot has slight discoloration to it from the autonomic nerve dysfunction presumably  His x-ray shows normal bony anatomy  At this point not sure what else to do for him.  I will get a nerve conduction study I will get a foot and ankle specialist to see him to see if there is anything to add and we will continue following him per Worker's Comp. guidelines

## 2023-09-15 ENCOUNTER — Encounter (HOSPITAL_COMMUNITY): Payer: Self-pay | Admitting: Emergency Medicine

## 2023-09-15 ENCOUNTER — Emergency Department (HOSPITAL_COMMUNITY)
Admission: EM | Admit: 2023-09-15 | Discharge: 2023-09-16 | Disposition: A | Discharge status: Home / Self Care | Payer: No Typology Code available for payment source

## 2023-09-15 ENCOUNTER — Emergency Department (HOSPITAL_COMMUNITY): Payer: No Typology Code available for payment source

## 2023-09-15 ENCOUNTER — Other Ambulatory Visit: Payer: Self-pay

## 2023-09-15 DIAGNOSIS — R0602 Shortness of breath: Secondary | ICD-10-CM | POA: Insufficient documentation

## 2023-09-15 DIAGNOSIS — Z87891 Personal history of nicotine dependence: Secondary | ICD-10-CM | POA: Insufficient documentation

## 2023-09-15 DIAGNOSIS — J40 Bronchitis, not specified as acute or chronic: Secondary | ICD-10-CM | POA: Diagnosis not present

## 2023-09-15 DIAGNOSIS — Z20822 Contact with and (suspected) exposure to covid-19: Secondary | ICD-10-CM | POA: Insufficient documentation

## 2023-09-15 DIAGNOSIS — Z79899 Other long term (current) drug therapy: Secondary | ICD-10-CM | POA: Diagnosis not present

## 2023-09-15 DIAGNOSIS — I10 Essential (primary) hypertension: Secondary | ICD-10-CM | POA: Diagnosis not present

## 2023-09-15 DIAGNOSIS — R062 Wheezing: Secondary | ICD-10-CM | POA: Diagnosis not present

## 2023-09-15 DIAGNOSIS — R059 Cough, unspecified: Secondary | ICD-10-CM | POA: Diagnosis present

## 2023-09-15 HISTORY — DX: Essential (primary) hypertension: I10

## 2023-09-15 LAB — RESP PANEL BY RT-PCR (RSV, FLU A&B, COVID)  RVPGX2
Influenza A by PCR: NEGATIVE
Influenza B by PCR: NEGATIVE
Resp Syncytial Virus by PCR: NEGATIVE
SARS Coronavirus 2 by RT PCR: NEGATIVE

## 2023-09-15 LAB — BASIC METABOLIC PANEL
Anion gap: 11 (ref 5–15)
BUN: 14 mg/dL (ref 6–20)
CO2: 26 mmol/L (ref 22–32)
Calcium: 9.4 mg/dL (ref 8.9–10.3)
Chloride: 100 mmol/L (ref 98–111)
Creatinine, Ser: 0.7 mg/dL (ref 0.61–1.24)
GFR, Estimated: >60 mL/min (ref >60)
Glucose, Bld: 159 mg/dL — ABNORMAL HIGH (ref 70–99)
Potassium: 3.6 mmol/L (ref 3.5–5.1)
Sodium: 137 mmol/L (ref 135–145)

## 2023-09-15 LAB — BRAIN NATRIURETIC PEPTIDE: B Natriuretic Peptide: 27 pg/mL (ref 0.0–100.0)

## 2023-09-15 MED ORDER — PREDNISONE 50 MG PO TABS: ORAL_TABLET | ORAL | 0 refills | Status: DC

## 2023-09-15 MED ORDER — PREDNISONE 50 MG PO TABS
60.0000 mg | ORAL_TABLET | Freq: Once | ORAL | Status: AC
  Administered 2023-09-15: 60 mg via ORAL
  Filled 2023-09-15: qty 1

## 2023-09-15 MED ORDER — IPRATROPIUM-ALBUTEROL 0.5-2.5 (3) MG/3ML IN SOLN
3.0000 mL | Freq: Once | RESPIRATORY_TRACT | Status: AC
  Administered 2023-09-15: 3 mL via RESPIRATORY_TRACT
  Filled 2023-09-15: qty 3

## 2023-09-15 MED ORDER — ALBUTEROL SULFATE HFA 108 (90 BASE) MCG/ACT IN AERS: 1.0000 | INHALATION_SPRAY | Freq: Four times a day (QID) | RESPIRATORY_TRACT | 0 refills | Status: DC | PRN

## 2023-09-15 MED ORDER — AZITHROMYCIN 250 MG PO TABS: 250.0000 mg | ORAL_TABLET | Freq: Every day | ORAL | 0 refills | Status: DC

## 2023-09-15 MED ORDER — AZITHROMYCIN 250 MG PO TABS
500.0000 mg | ORAL_TABLET | Freq: Once | ORAL | Status: AC
  Administered 2023-09-15: 500 mg via ORAL
  Filled 2023-09-15: qty 2

## 2023-09-15 NOTE — ED Provider Notes (Signed)
[place] EMERGENCY DEPARTMENT AT [facility] Provider Note   CSN: [id number] Arrival date & time: 09/15/23  2120     History  Chief Complaint  Patient presents with   Shortness of Breath    William Chapman is a 57 y.o. male.  57 year old male with past medical history of hypertension and previous smoking history presenting to the emergency department today with cough and shortness of breath.  This been going now for the past week or so.  The patient was unable to catch his breath at night which is why he called medics.  He was given DuoNebs and states that he is feeling little better since then.  He states that he is feeling more of a chest tightness when he coughs or tries to breathe deeply.  He denies a history of DVT or pulmonary embolism, recent surgeries, recent travel.  He denies any sharp pain with deep breathing.  He denies any leg pain or swelling.  He came to the ER today for further evaluation due to these ongoing symptoms.  He states that he has had recurrent bronchitis in the past.  He has never been officially diagnosed with asthma or COPD.   Shortness of Breath Associated symptoms: cough        Home Medications Prior to Admission medications   Medication Sig Start Date End Date Taking? Authorizing Provider  albuterol (VENTOLIN HFA) 108 (90 Base) MCG/ACT inhaler Inhale 1-2 puffs into the lungs every 6 (six) hours as needed for wheezing or shortness of breath. 09/15/23  Yes Durwin Glaze, MD  azithromycin (ZITHROMAX) 250 MG tablet Take 1 tablet (250 mg total) by mouth daily. Take 1 tablet daily for 4 days (first dose administered in ED) 09/15/23  Yes Durwin Glaze, MD  predniSONE (DELTASONE) 50 MG tablet Take 1 tablet by mouth daily 09/15/23  Yes Durwin Glaze, MD  allopurinol (ZYLOPRIM) 300 MG tablet Take 300 mg by mouth daily. 11/28/21   [provider]  amLODipine (NORVASC) 5 MG tablet Take 5 mg by mouth daily. 02/18/22   [provider]   ibuprofen (ADVIL) 800 MG tablet TAKE 1 TABLET BY MOUTH EVERY 8 HOURS AS NEEDED 07/22/23   Vickki Hearing, MD  lisinopril (ZESTRIL) 40 MG tablet Take 40 mg by mouth daily. 02/20/21   [provider]  pregabalin (LYRICA) 50 MG capsule Take 1 capsule (50 mg total) by mouth 2 (two) times daily. 03/13/23   Vickki Hearing, MD  venlafaxine XR (EFFEXOR-XR) 150 MG 24 hr capsule Take 150 mg by mouth daily with breakfast.  07/14/18   [provider]      Allergies    Iodinated contrast media    Review of Systems   Review of Systems  Respiratory:  Positive for cough and shortness of breath.   All other systems reviewed and are negative.   Physical Exam Updated Vital Signs BP (!) 138/96   Pulse 71   Temp 98.6 F (37 C) (Oral)   Resp 13   Ht 5\' 9"  (1.753 m)   Wt 94.8 kg   SpO2 100%   BMI 30.86 kg/m  Physical Exam Vitals and nursing note reviewed.   Gen: NAD Eyes: PERRL, EOMI HEENT: no oropharyngeal swelling Neck: trachea midline Resp: Diminished with diffuse wheezes throughout lung fields Card: RRR, no murmurs, rubs, or gallops Abd: nontender, nondistended Extremities: no calf tenderness, no edema Vascular: 2+ radial pulses bilaterally, 2+ DP pulses bilaterally Skin: no rashes Psyc:  acting appropriately   ED Results / Procedures / Treatments   Labs (all labs ordered are listed, but only abnormal results are displayed) Labs Reviewed  BASIC METABOLIC PANEL - Abnormal; Notable for the following components:      Result Value   Glucose, Bld 159 (*)    All other components within normal limits  RESP PANEL BY RT-PCR (RSV, FLU A&B, COVID)  RVPGX2  BRAIN NATRIURETIC PEPTIDE    EKG EKG Interpretation Date/Time:  Monday September 15 2023 21:28:41 EDT Ventricular Rate:  88 PR Interval:  152 QRS Duration:  92 QT Interval:  359 QTC Calculation: 435 R Axis:   56  Text Interpretation: Sinus rhythm Probable left atrial enlargement RSR' in V1 or V2, right VCD or  RVH Confirmed by Beckey Downing [phone]) on 09/15/2023 10:33:57 PM  Radiology No results found.  Procedures Procedures    Medications Ordered in ED Medications  ipratropium-albuterol (DUONEB) 0.5-2.5 (3) MG/3ML nebulizer solution 3 mL (3 mLs Nebulization Given 09/15/23 2232)  ipratropium-albuterol (DUONEB) 0.5-2.5 (3) MG/3ML nebulizer solution 3 mL (3 mLs Nebulization Given 09/15/23 2232)  predniSONE (DELTASONE) tablet 60 mg (60 mg Oral Given 09/15/23 2231)  azithromycin (ZITHROMAX) tablet 500 mg (500 mg Oral Given 09/15/23 2232)    ED Course/ Medical Decision Making/ A&P                                 Medical Decision Making 57 year old male with past medical history of tobacco abuse in the past and recurrent bronchitis as well as hypertension presenting to the emergency department today with shortness of breath.  I will further evaluate the patient here with basic labs as well as an EKG and chest x-ray for further evaluation for cardiac etiology, pulmonary edema, or pulmonary infiltrates.  The patient is wheezing here on exam.  I will give him additional DuoNeb's here as well as prednisone.  Also given azithromycin.  I will reevaluate for ultimate disposition.  He is overall well-appearing so hopefully if his symptoms improve he will be able to be discharged.  The patient's EKG interpreted by me shows a sinus rhythm with a rate of 88 with normal axis, normal intervals, and no significant ST-T changes.  The patient's labs are reassuring.  His chest x-ray interpreted by me shows no acute infiltrates, no pulmonary edema, no pneumothorax.  The official read is not back at the time of reassessment.  He is ultimately discharged with return precautions for bronchitis and likely COPD exacerbation.  Amount and/or Complexity of Data Reviewed Labs: ordered. Radiology: ordered.  Risk Prescription drug management.           Final Clinical Impression(s) / ED Diagnoses Final diagnoses:   Bronchitis    Rx / DC Orders ED Discharge Orders          Ordered    azithromycin (ZITHROMAX) 250 MG tablet  Daily        09/15/23 2357    predniSONE (DELTASONE) 50 MG tablet        09/15/23 2357    albuterol (VENTOLIN HFA) 108 (90 Base) MCG/ACT inhaler  Every 6 hours PRN        09/15/23 2357              Durwin Glaze, MD 09/15/23 2358

## 2023-09-15 NOTE — ED Notes (Signed)
Pt to XR

## 2023-09-15 NOTE — ED Triage Notes (Signed)
C/o sob x4 days ago. States difficulty catching his breath. Per ems, 92% RA; wheezing throughout. HTN 160/100. Pt states normal SBP around 200. Hx of bronchitis. No hx of asthma.   7.5mg  albuterol tx O2 at 100% during tx.

## 2023-09-15 NOTE — Discharge Instructions (Signed)
Please take the antibiotic and steroid.  Use 2 puffs of the albuterol every 4 hours over the next 24 to 48 hours.  You may then use as needed.  Please follow-up with your doctor for reevaluation.  Return to the ER for worsening symptoms.

## 2023-09-16 DIAGNOSIS — Z6830 Body mass index (BMI) 30.0-30.9, adult: Secondary | ICD-10-CM | POA: Diagnosis not present

## 2023-09-16 DIAGNOSIS — E669 Obesity, unspecified: Secondary | ICD-10-CM | POA: Diagnosis not present

## 2023-09-16 DIAGNOSIS — F411 Generalized anxiety disorder: Secondary | ICD-10-CM | POA: Diagnosis not present

## 2023-09-16 DIAGNOSIS — R2681 Unsteadiness on feet: Secondary | ICD-10-CM | POA: Diagnosis not present

## 2023-09-16 DIAGNOSIS — I10 Essential (primary) hypertension: Secondary | ICD-10-CM | POA: Diagnosis not present

## 2023-09-16 DIAGNOSIS — Z008 Encounter for other general examination: Secondary | ICD-10-CM | POA: Diagnosis not present

## 2023-09-16 NOTE — ED Notes (Signed)
Reviewed D/C information with the patient & significant other, pt verbalized understanding. No additional concerns at this time.

## 2023-09-18 ENCOUNTER — Other Ambulatory Visit: Payer: Self-pay | Admitting: Orthopedic Surgery

## 2023-09-18 DIAGNOSIS — G90522 Complex regional pain syndrome I of left lower limb: Secondary | ICD-10-CM

## 2023-09-18 DIAGNOSIS — S9032XS Contusion of left foot, sequela: Secondary | ICD-10-CM

## 2023-09-25 ENCOUNTER — Ambulatory Visit (INDEPENDENT_AMBULATORY_CARE_PROVIDER_SITE_OTHER): Payer: No Typology Code available for payment source | Admitting: Orthopedic Surgery

## 2023-09-25 DIAGNOSIS — E1165 Type 2 diabetes mellitus with hyperglycemia: Secondary | ICD-10-CM | POA: Diagnosis not present

## 2023-09-25 DIAGNOSIS — M109 Gout, unspecified: Secondary | ICD-10-CM

## 2023-09-25 MED ORDER — COLCHICINE 0.6 MG PO CAPS: 0.6000 mg | ORAL_CAPSULE | Freq: Two times a day (BID) | ORAL | 3 refills | Status: DC | PRN

## 2023-09-26 ENCOUNTER — Other Ambulatory Visit: Payer: Self-pay | Admitting: Orthopedic Surgery

## 2023-09-26 ENCOUNTER — Encounter: Payer: Self-pay | Admitting: Orthopedic Surgery

## 2023-09-26 ENCOUNTER — Telehealth: Payer: Self-pay

## 2023-09-26 LAB — HEMOGLOBIN A1C
Hgb A1c MFr Bld: 7.6 %{Hb} — ABNORMAL HIGH (ref <5.7)
Mean Plasma Glucose: 171 mg/dL
eAG (mmol/L): 9.5 mmol/L

## 2023-09-26 LAB — URIC ACID: Uric Acid, Serum: 8.3 mg/dL — ABNORMAL HIGH (ref 4.0–8.0)

## 2023-09-26 MED ORDER — COLCHICINE 0.6 MG PO TABS: 0.6000 mg | ORAL_TABLET | Freq: Every day | ORAL | 4 refills | Status: DC

## 2023-09-26 NOTE — Telephone Encounter (Signed)
[facility] pharmacy called stating that Rx for Colchicine needs to be changed from capsules to tablets.  Okay to give a verbal.  CB# [phone].  Please advise.  Thank you.

## 2023-09-26 NOTE — Progress Notes (Signed)
Office Visit Note   Patient: William Chapman           Date of Birth: December 05, 1965           MRN: [id number] Visit Date: 09/25/2023              Requested by: Vickki Hearing, MD [address] [place],  [place] [address] PCP: Estanislado Pandy, MD  Chief Complaint  Patient presents with   Left Foot - Pain      HPI: Patient is a 57 year old gentleman who is seen for initial evaluation for left foot pain.  Patient states he has had pain for 3 years.  Patient read ports that he drops some metal roofing on his foot 3 years ago.  Patient states that he has had his uric acid drawn years ago.  Recent radiographs obtained October 25 shows no evidence of a fracture.  Patient complains of pain swelling and numbness into his toes.  Patient states he is on prednisone for bronchitis.  He states he has been on allopurinol 300 mg a day but has not had his uric acid checked.  Patient denies any gout flareups.  Assessment & Plan: Visit Diagnoses:  1. Gout of right foot, unspecified cause, unspecified chronicity     Plan: Will start patient on colchicine 0.6 mg a day he will continue with his allopurinol.  Will draw both a uric acid and a hemoglobin A1c.  Patient will need to follow-up with his primary care physician for treatment of diabetes.  Follow-Up Instructions: Return in about 4 weeks (around 10/23/2023).   Ortho Exam  Patient is alert, oriented, no adenopathy, well-dressed, normal affect, normal respiratory effort. Examination patient has a palpable dorsalis pedis pulse he is extremely tender to palpation over all metatarsal heads as well as the base of the metatarsals across the Lisfranc complex.  Review of the radiographs shows no evidence of fractures.  There is no redness or cellulitis in his foot there is no swelling.  Hemoglobin A1c drawn today is 7.6 and uric acid is 8.3.  Imaging: No results found. No images are attached to the encounter.  Labs: Lab Results  Component  Value Date   HGBA1C 7.6 (H) 09/25/2023   LABURIC 8.3 (H) 09/25/2023     Lab Results  Component Value Date   ALBUMIN 3.4 (L) 08/20/2018   ALBUMIN 3.9 08/19/2018    No results found for: "MG" No results found for: "VD25OH"  No results found for: "PREALBUMIN"    Latest Ref Rng & Units 08/20/2018    5:30 AM 08/19/2018    8:18 PM  CBC EXTENDED  WBC 4.0 - 10.5 K/uL 5.3  5.8   RBC 4.22 - 5.81 MIL/uL 3.77  3.92   Hemoglobin 13.0 - 17.0 g/dL 21.3  08.6   HCT 57.8 - 52.0 % 36.9  37.8   Platelets 150 - 400 K/uL 222  235   NEUT# 1.7 - 7.7 K/uL  2.8   Lymph# 0.7 - 4.0 K/uL  2.4      There is no height or weight on file to calculate BMI.  Orders:  Orders Placed This Encounter  Procedures   Uric acid   Hemoglobin A1C   Meds ordered this encounter  Medications   Colchicine 0.6 MG CAPS    Sig: Take 1 capsule (0.6 mg total) by mouth 2 (two) times daily as needed.    Dispense:  30 capsule    Refill:  3  Procedures: No procedures performed  Clinical Data: No additional findings.  ROS:  All other systems negative, except as noted in the HPI. Review of Systems  Objective: Vital Signs: There were no vitals taken for this visit.  Specialty Comments:  No specialty comments available.  PMFS History: Patient Active Problem List   Diagnosis Date Noted   Contusion of left foot 07/26/2020   Alcohol abuse with intoxication/BAL 229 08/20/2018   Depression 08/20/2018   Syncope and collapse 08/19/2018   Past Medical History:  Diagnosis Date   Hypertension     Family History  Problem Relation Age of Onset   Healthy Mother    COPD Father     History reviewed. No pertinent surgical history. Social History   Occupational History   Not on file  Tobacco Use   Smoking status: Former    Current packs/day: 1.00    Types: Cigarettes   Smokeless tobacco: Never  Vaping Use   Vaping status: Never Used  Substance and Sexual Activity   Alcohol use: Yes    Comment:  liquor 3x week   Drug use: Not Currently   Sexual activity: Yes    Birth control/protection: None

## 2023-09-29 ENCOUNTER — Telehealth: Payer: Self-pay

## 2023-09-29 NOTE — Telephone Encounter (Signed)
Pt called back and we discussed his lab results. He voiced understanding to below recommendations and will call with any questions or concerns.

## 2023-09-29 NOTE — Telephone Encounter (Signed)
I called and lm on vm for pt to call back to discuss labs

## 2023-09-29 NOTE — Telephone Encounter (Signed)
Talked with patient and advised him of message below.  

## 2023-09-29 NOTE — Telephone Encounter (Signed)
-----   Message from Nadara Mustard sent at 09/26/2023  4:44 PM EST ----- Call patient.  His uric acid was elevated 8.3 this is consistent with gout.  Have him take the colchicine daily.  His hemoglobin A1c is 7.6 this is consistent with diabetes.  Have him follow-up with his primary care physician for treatment. ----- Message ----- From: Janace Hoard Lab Results In Sent: 09/26/2023   1:15 AM EST To: Nadara Mustard, MD

## 2023-10-03 DIAGNOSIS — I10 Essential (primary) hypertension: Secondary | ICD-10-CM | POA: Diagnosis not present

## 2023-10-03 DIAGNOSIS — E1140 Type 2 diabetes mellitus with diabetic neuropathy, unspecified: Secondary | ICD-10-CM | POA: Diagnosis not present

## 2023-10-03 DIAGNOSIS — Z008 Encounter for other general examination: Secondary | ICD-10-CM | POA: Diagnosis not present

## 2023-10-06 ENCOUNTER — Other Ambulatory Visit: Payer: Self-pay | Admitting: *Deleted

## 2023-10-06 DIAGNOSIS — R1900 Intra-abdominal and pelvic swelling, mass and lump, unspecified site: Secondary | ICD-10-CM

## 2023-10-07 ENCOUNTER — Ambulatory Visit (INDEPENDENT_AMBULATORY_CARE_PROVIDER_SITE_OTHER): Payer: No Typology Code available for payment source | Admitting: General Surgery

## 2023-10-07 ENCOUNTER — Encounter: Payer: Self-pay | Admitting: General Surgery

## 2023-10-07 VITALS — BP 165/103 | HR 78 | Temp 98.1°F | Resp 12 | Ht 69.0 in | Wt 209.0 lb

## 2023-10-07 DIAGNOSIS — M6208 Separation of muscle (nontraumatic), other site: Secondary | ICD-10-CM | POA: Diagnosis not present

## 2023-10-07 NOTE — Patient Instructions (Addendum)
Diastasis recti - post partum work outs; working the deeper muscles transverse abdominus  Look on youtube to see if you can find exercises that work.  [employer] may be a place to start.  Look for exercises or videos with lots of views.   If any changes or start to notice any bulges that could be hernia let us know. This would be a bulge that is like 1inch or smaller on top of the bigger diastasis bulge.

## 2023-10-07 NOTE — Progress Notes (Unsigned)
[facility] History and Physical  Reason for Referral:*** Referring Physician: ***  Chief Complaint   New Patient (Initial Visit)     William Chapman is a 57 y.o. male.  HPI: ***.  The *** started *** and has had a duration of ***.  It is associated with ***.  The *** is improved with ***, and is made worse with ***.    Quality*** Context***  Past Medical History:  Diagnosis Date  . Hypertension     No past surgical history on file.  Family History  Problem Relation Age of Onset  . Healthy Mother   . COPD Father     Social History   Tobacco Use  . Smoking status: Former    Current packs/day: 1.00    Types: Cigarettes  . Smokeless tobacco: Never  Vaping Use  . Vaping status: Never Used  Substance Use Topics  . Alcohol use: Yes    Comment: liquor 3x week  . Drug use: Not Currently    Medications: {medication reviewed/display:3041432} Allergies as of 10/07/2023       Reactions   Iodinated Contrast Media Hives, Itching   Sneezed twice. Got stuffy/. Started itching. Hives noted.        Medication List        Accurate as of October 07, 2023 11:06 AM. If you have any questions, ask your nurse or doctor.          STOP taking these medications    allopurinol 300 MG tablet Commonly known as: ZYLOPRIM Stopped by: Lucretia Roers   azithromycin 250 MG tablet Commonly known as: ZITHROMAX Stopped by: Lucretia Roers   lisinopril 40 MG tablet Commonly known as: ZESTRIL Stopped by: Lucretia Roers   predniSONE 50 MG tablet Commonly known as: DELTASONE Stopped by: Lucretia Roers       TAKE these medications    albuterol 108 (90 Base) MCG/ACT inhaler Commonly known as: VENTOLIN HFA Inhale 1-2 puffs into the lungs every 6 (six) hours as needed for wheezing or shortness of breath.   amLODipine 5 MG tablet Commonly known as: NORVASC Take 5 mg by mouth daily.   Colchicine 0.6 MG Caps Take 1 capsule (0.6 mg total)  by mouth 2 (two) times daily as needed. What changed: Another medication with the same name was removed. Continue taking this medication, and follow the directions you see here. Changed by: Lucretia Roers   ibuprofen 800 MG tablet Commonly known as: ADVIL TAKE 1 TABLET BY MOUTH EVERY 8 HOURS AS NEEDED   losartan 25 MG tablet Commonly known as: COZAAR Take 25 mg by mouth daily.   pregabalin 50 MG capsule Commonly known as: LYRICA Take 1 capsule (50 mg total) by mouth 2 (two) times daily.   venlafaxine XR 150 MG 24 hr capsule Commonly known as: EFFEXOR-XR Take 150 mg by mouth daily with breakfast.         ROS:  {Review of Systems:30496}  Blood pressure (!) 165/103, pulse 78, temperature 98.1 F (36.7 C), temperature source Oral, resp. rate 12, height 5\' 9"  (1.753 m), weight 209 lb (94.8 kg), SpO2 96%. Physical Exam  Results: No results found for this or any previous visit (from the past 48 hour(s)).  No results found.   Assessment & Plan:  William Chapman is a 57 y.o. male with *** -*** -*** -Follow up ***  All questions were answered to the satisfaction of the patient and family***.  The risk  and benefits of *** were discussed including but not limited to ***.  After careful consideration, William Chapman has decided to ***.    Lucretia Roers 10/07/2023, 11:06 AM

## 2023-10-08 ENCOUNTER — Encounter: Payer: Self-pay | Admitting: General Surgery

## 2023-10-17 DIAGNOSIS — I10 Essential (primary) hypertension: Secondary | ICD-10-CM | POA: Diagnosis not present

## 2023-10-17 DIAGNOSIS — E1165 Type 2 diabetes mellitus with hyperglycemia: Secondary | ICD-10-CM | POA: Diagnosis not present

## 2023-10-20 NOTE — Progress Notes (Unsigned)
   New Patient Office Visit   Subjective   Patient ID: BUZZ VITULLI, male    DOB: [birth year]  Age: 57 y.o. MRN: [id number]  CC: No chief complaint on file.   HPI WELBORN BOTTINO 57 year old male,  presents to establish care. He  has a past medical history of Hypertension.  HPI    Outpatient Encounter Medications as of 10/21/2023  Medication Sig   albuterol (VENTOLIN HFA) 108 (90 Base) MCG/ACT inhaler Inhale 1-2 puffs into the lungs every 6 (six) hours as needed for wheezing or shortness of breath.   amLODipine (NORVASC) 5 MG tablet Take 5 mg by mouth daily.   Colchicine 0.6 MG CAPS Take 1 capsule (0.6 mg total) by mouth 2 (two) times daily as needed.   ibuprofen (ADVIL) 800 MG tablet TAKE 1 TABLET BY MOUTH EVERY 8 HOURS AS NEEDED   losartan (COZAAR) 25 MG tablet Take 25 mg by mouth daily.   pregabalin (LYRICA) 50 MG capsule Take 1 capsule (50 mg total) by mouth 2 (two) times daily.   venlafaxine XR (EFFEXOR-XR) 150 MG 24 hr capsule Take 150 mg by mouth daily with breakfast.    No facility-administered encounter medications on file as of 10/21/2023.    No past surgical history on file.  ROS    Objective    There were no vitals taken for this visit.  Physical Exam    Assessment & Plan:  There are no diagnoses linked to this encounter.  No follow-ups on file.   Cruzita Lederer Newman Nip, FNP

## 2023-10-20 NOTE — Patient Instructions (Signed)

## 2023-10-21 ENCOUNTER — Encounter: Payer: Self-pay | Admitting: Family Medicine

## 2023-10-21 ENCOUNTER — Ambulatory Visit (INDEPENDENT_AMBULATORY_CARE_PROVIDER_SITE_OTHER): Payer: No Typology Code available for payment source | Admitting: Family Medicine

## 2023-10-21 VITALS — BP 158/87 | HR 84 | Ht 69.0 in | Wt 212.0 lb

## 2023-10-21 DIAGNOSIS — I10 Essential (primary) hypertension: Secondary | ICD-10-CM

## 2023-10-21 DIAGNOSIS — Z1211 Encounter for screening for malignant neoplasm of colon: Secondary | ICD-10-CM

## 2023-10-21 DIAGNOSIS — N400 Enlarged prostate without lower urinary tract symptoms: Secondary | ICD-10-CM | POA: Diagnosis not present

## 2023-10-21 DIAGNOSIS — E1165 Type 2 diabetes mellitus with hyperglycemia: Secondary | ICD-10-CM | POA: Diagnosis not present

## 2023-10-21 DIAGNOSIS — E038 Other specified hypothyroidism: Secondary | ICD-10-CM | POA: Diagnosis not present

## 2023-10-21 DIAGNOSIS — E538 Deficiency of other specified B group vitamins: Secondary | ICD-10-CM

## 2023-10-21 DIAGNOSIS — Z1159 Encounter for screening for other viral diseases: Secondary | ICD-10-CM

## 2023-10-21 DIAGNOSIS — E559 Vitamin D deficiency, unspecified: Secondary | ICD-10-CM | POA: Diagnosis not present

## 2023-10-21 DIAGNOSIS — Z114 Encounter for screening for human immunodeficiency virus [HIV]: Secondary | ICD-10-CM | POA: Diagnosis not present

## 2023-10-21 DIAGNOSIS — Z7984 Long term (current) use of oral hypoglycemic drugs: Secondary | ICD-10-CM | POA: Diagnosis not present

## 2023-10-21 DIAGNOSIS — E119 Type 2 diabetes mellitus without complications: Secondary | ICD-10-CM | POA: Insufficient documentation

## 2023-10-21 MED ORDER — METFORMIN HCL 500 MG PO TABS: 500.0000 mg | ORAL_TABLET | Freq: Two times a day (BID) | ORAL | 1 refills | Status: DC

## 2023-10-21 MED ORDER — LOSARTAN POTASSIUM 50 MG PO TABS: 50.0000 mg | ORAL_TABLET | Freq: Every day | ORAL | 3 refills | Status: DC

## 2023-10-21 NOTE — Assessment & Plan Note (Signed)
Increased Losartan 50 mg once daily Continue Amlodipine 10 mg once daily Labs ordered. Discussed with  patient to monitor their blood pressure regularly and maintain a heart-healthy diet rich in fruits, vegetables, whole grains, and low-fat dairy, while reducing sodium intake to less than 2,300 mg per day. Regular physical activity, such as 30 minutes of moderate exercise most days of the week, will help lower blood pressure and improve overall cardiovascular health. Avoiding smoking, limiting alcohol consumption, and managing stress. Take  prescribed medication, & take it as directed and avoid skipping doses. Seek emergency care if your blood pressure is (over 180/100) or you experience chest pain, shortness of breath, or sudden vision changes.Patient verbalizes understanding regarding plan of care and all questions answered. Follow up in 4 weeks with at home blood pressure to monitor trends.

## 2023-10-21 NOTE — Assessment & Plan Note (Signed)
Last Hemoglobin A1c: 7.6 Started Metformin 500 mg twice daily  Reviewed non-pharmacological interventions, including a balanced diet rich in lean proteins, healthy fats, whole grains, and high-fiber vegetables. Emphasized reducing refined sugars and processed carbohydrates, and incorporating more fruits, leafy greens, and legumes. Education: Patient was educated on recognizing signs and symptoms of both hypoglycemia and hyperglycemia, and advised to seek emergency care if these symptoms occur. Follow-Up: Scheduled for follow-up in 3-4 months, or sooner if needed. Patient Understanding: The patient verbalized understanding of the care plan, and all questions were answered. Additional Care: Ophthalmology referral was placed. Foot exam results were within normal limits.

## 2023-10-22 ENCOUNTER — Other Ambulatory Visit: Payer: Self-pay | Admitting: Family Medicine

## 2023-10-22 ENCOUNTER — Telehealth: Payer: Self-pay

## 2023-10-22 LAB — PSA: Prostate Specific Ag, Serum: 0.7 ng/mL (ref 0.0–4.0)

## 2023-10-22 MED ORDER — ATORVASTATIN CALCIUM 40 MG PO TABS: 40.0000 mg | ORAL_TABLET | Freq: Every day | ORAL | 3 refills | Status: AC

## 2023-10-22 NOTE — Telephone Encounter (Signed)
Thanks ill cancel the order

## 2023-10-22 NOTE — Addendum Note (Signed)
Addended by: Rica Records on: 10/22/2023 12:49 PM   Modules accepted: Orders

## 2023-10-22 NOTE — Telephone Encounter (Signed)
Copied from CRM [id number]. Topic: Clinical - Lab/Test Results >> Oct 21, 2023  3:00 PM Donita Brooks wrote: Reason for CRM: pt called in to inform Dr. Jennette Banker that he received his results from his cologuard test and the results were negative and that Dr. Newman Nip doesn't have too order another test

## 2023-10-22 NOTE — Telephone Encounter (Signed)
Thank you :)

## 2023-10-23 ENCOUNTER — Ambulatory Visit (INDEPENDENT_AMBULATORY_CARE_PROVIDER_SITE_OTHER): Payer: No Typology Code available for payment source | Admitting: Orthopedic Surgery

## 2023-10-23 DIAGNOSIS — M109 Gout, unspecified: Secondary | ICD-10-CM | POA: Diagnosis not present

## 2023-10-23 DIAGNOSIS — E1165 Type 2 diabetes mellitus with hyperglycemia: Secondary | ICD-10-CM | POA: Diagnosis not present

## 2023-10-23 LAB — CBC WITH DIFFERENTIAL/PLATELET
Basophils Absolute: 0.1 10*3/uL (ref 0.0–0.2)
Basos: 1 %
EOS (ABSOLUTE): 0.3 10*3/uL (ref 0.0–0.4)
Eos: 5 %
Hematocrit: 43.1 % (ref 37.5–51.0)
Hemoglobin: 15.3 g/dL (ref 13.0–17.7)
Immature Grans (Abs): 0 10*3/uL (ref 0.0–0.1)
Immature Granulocytes: 0 %
Lymphocytes Absolute: 2.2 10*3/uL (ref 0.7–3.1)
Lymphs: 40 %
MCH: 34.2 pg — ABNORMAL HIGH (ref 26.6–33.0)
MCHC: 35.5 g/dL (ref 31.5–35.7)
MCV: 96 fL (ref 79–97)
Monocytes Absolute: 0.3 10*3/uL (ref 0.1–0.9)
Monocytes: 6 %
Neutrophils Absolute: 2.6 10*3/uL (ref 1.4–7.0)
Neutrophils: 48 %
Platelets: 288 10*3/uL (ref 150–450)
RBC: 4.47 x10E6/uL (ref 4.14–5.80)
RDW: 13.3 % (ref 11.6–15.4)
WBC: 5.5 10*3/uL (ref 3.4–10.8)

## 2023-10-23 LAB — CMP14+EGFR
ALT: 39 [IU]/L (ref 0–44)
AST: 32 [IU]/L (ref 0–40)
Albumin: 4.4 g/dL (ref 3.8–4.9)
Alkaline Phosphatase: 97 [IU]/L (ref 44–121)
BUN/Creatinine Ratio: 21 — ABNORMAL HIGH (ref 9–20)
BUN: 15 mg/dL (ref 6–24)
Bilirubin Total: 0.2 mg/dL (ref 0.0–1.2)
CO2: 20 mmol/L (ref 20–29)
Calcium: 9.8 mg/dL (ref 8.7–10.2)
Chloride: 101 mmol/L (ref 96–106)
Creatinine, Ser: 0.73 mg/dL — ABNORMAL LOW (ref 0.76–1.27)
Globulin, Total: 2.8 g/dL (ref 1.5–4.5)
Glucose: 136 mg/dL — ABNORMAL HIGH (ref 70–99)
Potassium: 4.7 mmol/L (ref 3.5–5.2)
Sodium: 137 mmol/L (ref 134–144)
Total Protein: 7.2 g/dL (ref 6.0–8.5)
eGFR: 106 mL/min/{1.73_m2} (ref >59)

## 2023-10-23 LAB — LIPID PANEL
Chol/HDL Ratio: 5.3 {ratio} — ABNORMAL HIGH (ref 0.0–5.0)
Cholesterol, Total: 306 mg/dL — ABNORMAL HIGH (ref 100–199)
HDL: 58 mg/dL (ref >39)
LDL Chol Calc (NIH): 176 mg/dL — ABNORMAL HIGH (ref 0–99)
Triglycerides: 369 mg/dL — ABNORMAL HIGH (ref 0–149)
VLDL Cholesterol Cal: 72 mg/dL — ABNORMAL HIGH (ref 5–40)

## 2023-10-23 LAB — HIV ANTIBODY (ROUTINE TESTING W REFLEX): HIV Screen 4th Generation wRfx: NONREACTIVE

## 2023-10-23 LAB — TSH+FREE T4
Free T4: 0.9 ng/dL (ref 0.82–1.77)
TSH: 1.06 u[IU]/mL (ref 0.450–4.500)

## 2023-10-23 LAB — MICROALBUMIN / CREATININE URINE RATIO
Creatinine, Urine: 183.3 mg/dL
Microalb/Creat Ratio: 5 mg/g{creat} (ref 0–29)
Microalbumin, Urine: 9 ug/mL

## 2023-10-23 LAB — VITAMIN D 25 HYDROXY (VIT D DEFICIENCY, FRACTURES): Vit D, 25-Hydroxy: 13.2 ng/mL — ABNORMAL LOW (ref 30.0–100.0)

## 2023-10-23 LAB — VITAMIN B12: Vitamin B-12: 285 pg/mL (ref 232–1245)

## 2023-10-23 LAB — HEPATITIS C ANTIBODY: Hep C Virus Ab: NONREACTIVE

## 2023-10-24 DIAGNOSIS — I10 Essential (primary) hypertension: Secondary | ICD-10-CM | POA: Diagnosis not present

## 2023-10-24 DIAGNOSIS — Z008 Encounter for other general examination: Secondary | ICD-10-CM | POA: Diagnosis not present

## 2023-10-27 ENCOUNTER — Telehealth: Payer: Self-pay | Admitting: Radiology

## 2023-10-27 ENCOUNTER — Encounter: Payer: Self-pay | Admitting: Orthopedic Surgery

## 2023-10-27 NOTE — Telephone Encounter (Signed)
Neurology has been unable to scheduled patient for EMG / and office visit  They have left multiple messages for him he has not returned calls  To you [facility]

## 2023-10-27 NOTE — Progress Notes (Signed)
Office Visit Note   Patient: William Chapman           Date of Birth: Aug 18, 1966           MRN: [id number] Visit Date: 10/23/2023              Requested by: Sasser, Clarene Critchley, MD [address] [place],  [place] [address] PCP: Rica Records, FNP  Chief Complaint  Patient presents with   Left Foot - Follow-up      HPI: Patient is a 57 year old gentleman who presents in follow-up for gout left foot.  Patient's most recent uric acid was 8.3 on November 7.  Currently on colchicine twice a day.  Patient states he recently started metformin for his type 2 diabetes.  He is also started losartan.  Assessment & Plan: Visit Diagnoses:  1. Gout of right foot, unspecified cause, unspecified chronicity   2. Type 2 diabetes mellitus with hyperglycemia, without long-term current use of insulin (HCC)     Plan: Recommended patient take allopurinol twice a day and colchicine once a day.  Repeat uric acid at follow-up in 3 months.  Follow-Up Instructions: Return in about 3 months (around 01/21/2024).   Ortho Exam  Patient is alert, oriented, no adenopathy, well-dressed, normal affect, normal respiratory effort. Examination patient has pain across the midfoot.  There is no redness no cellulitis no open wounds no tophaceous deposits.  Patient has pain with distraction across the midfoot.  Imaging: No results found. No images are attached to the encounter.  Labs: Lab Results  Component Value Date   HGBA1C 7.6 (H) 09/25/2023   LABURIC 8.3 (H) 09/25/2023     Lab Results  Component Value Date   ALBUMIN 4.4 10/21/2023   ALBUMIN 3.4 (L) 08/20/2018   ALBUMIN 3.9 08/19/2018    No results found for: "MG" Lab Results  Component Value Date   VD25OH 13.2 (L) 10/21/2023    No results found for: "PREALBUMIN"    Latest Ref Rng & Units 10/21/2023    9:46 AM 08/20/2018    5:30 AM 08/19/2018    8:18 PM  CBC EXTENDED  WBC 3.4 - 10.8 x10E3/uL 5.5  5.3  5.8   RBC 4.14 - 5.80 x10E6/uL  4.47  3.77  3.92   Hemoglobin 13.0 - 17.7 g/dL 19.1  47.8  29.5   HCT 37.5 - 51.0 % 43.1  36.9  37.8   Platelets 150 - 450 x10E3/uL 288  222  235   NEUT# 1.4 - 7.0 x10E3/uL 2.6   2.8   Lymph# 0.7 - 3.1 x10E3/uL 2.2   2.4      There is no height or weight on file to calculate BMI.  Orders:  No orders of the defined types were placed in this encounter.  No orders of the defined types were placed in this encounter.    Procedures: No procedures performed  Clinical Data: No additional findings.  ROS:  All other systems negative, except as noted in the HPI. Review of Systems  Objective: Vital Signs: There were no vitals taken for this visit.  Specialty Comments:  No specialty comments available.  PMFS History: Patient Active Problem List   Diagnosis Date Noted   Hypertension 10/21/2023   Type 2 diabetes mellitus (HCC) 10/21/2023   Contusion of left foot 07/26/2020   Syncope and collapse 08/19/2018   Past Medical History:  Diagnosis Date   Depression    Hypertension  Family History  Problem Relation Age of Onset   Healthy Mother    COPD Father     History reviewed. No pertinent surgical history. Social History   Occupational History   Occupation: disabled.  Tobacco Use   Smoking status: Every Day    Current packs/day: 0.50    Types: Cigarettes   Smokeless tobacco: Never  Vaping Use   Vaping status: Never Used  Substance and Sexual Activity   Alcohol use: Yes    Comment: liquor 3x week   Drug use: Not Currently   Sexual activity: Not Currently    Birth control/protection: None

## 2023-10-31 DIAGNOSIS — I10 Essential (primary) hypertension: Secondary | ICD-10-CM | POA: Diagnosis not present

## 2023-10-31 DIAGNOSIS — Z008 Encounter for other general examination: Secondary | ICD-10-CM | POA: Diagnosis not present

## 2023-11-17 NOTE — Patient Instructions (Signed)
        Great to see you today.  I have refilled the medication(s) we provide.     - Please take medications as prescribed. - Follow up with your primary health provider if any health concerns arises. - If symptoms worsen please contact your primary care provider and/or visit the emergency department.

## 2023-11-17 NOTE — Progress Notes (Signed)
   Established Patient Office Visit   Subjective  Patient ID: William Chapman, male    DOB: 1966/03/13  Age: 57 y.o. MRN: [id number]  Chief Complaint  Patient presents with   Follow-up    No conerns.    He  has a past medical history of Depression, Hyperlipidemia, Hypertension, and Type 2 diabetes mellitus (HCC).  HPI Patient presents to the clinic for hypertension follow up. For the details of today's visit, please refer to assessment and plan.   Review of Systems  Constitutional:  Negative for chills and fever.  Eyes:  Negative for blurred vision.  Respiratory:  Negative for shortness of breath.   Cardiovascular:  Negative for chest pain.  Gastrointestinal:  Negative for abdominal pain.  Neurological:  Negative for dizziness and headaches.      Objective:     BP (!) 148/88   Pulse 86   Ht 5' 10 (1.778 m)   Wt 207 lb (93.9 kg)   SpO2 95%   BMI 29.70 kg/m  BP Readings from Last 3 Encounters:  11/20/23 (!) 148/88  10/21/23 (!) 158/87  10/07/23 (!) 165/103      Physical Exam Vitals reviewed.  Constitutional:      General: He is not in acute distress.    Appearance: Normal appearance. He is not ill-appearing, toxic-appearing or diaphoretic.  HENT:     Head: Normocephalic.  Eyes:     General:        Right eye: No discharge.        Left eye: No discharge.     Conjunctiva/sclera: Conjunctivae normal.  Cardiovascular:     Rate and Rhythm: Normal rate.     Pulses: Normal pulses.     Heart sounds: Normal heart sounds.  Pulmonary:     Effort: Pulmonary effort is normal. No respiratory distress.     Breath sounds: Normal breath sounds.  Skin:    General: Skin is warm and dry.     Capillary Refill: Capillary refill takes less than 2 seconds.  Neurological:     Mental Status: He is alert.     Gait: Gait normal.  Psychiatric:        Mood and Affect: Mood normal.        Behavior: Behavior normal.      No results found for any visits on 11/20/23.  The  10-year ASCVD risk score (Arnett DK, et al., 2019) is: 39.9%    Assessment & Plan:  Primary hypertension Assessment & Plan: Vitals:   11/20/23 1022 11/20/23 1029  BP: (!) 151/94 (!) 148/88  Increased Losartan -hydrochlorothiazide 100-25 mg once daily Continue Amlodipine 10 mg once daily Continued discussion on DASH diet, low sodium diet and maintain a exercise routine for 150 minutes per week.  Follow up with at home blood pressure via my chart to monitor trends   Other orders -     Losartan  Potassium-HCTZ; Take 1 tablet by mouth daily.  Dispense: 30 tablet; Refill: 3    Return in about 3 months (around 02/18/2024), or if symptoms worsen or fail to improve, for hypertension, type 2 diabetes, re-check blood pressure, hyperlipidemia.   Hilario Kidd Wilhelmena Falter, FNP

## 2023-11-18 DIAGNOSIS — H52223 Regular astigmatism, bilateral: Secondary | ICD-10-CM | POA: Diagnosis not present

## 2023-11-20 ENCOUNTER — Ambulatory Visit: Payer: No Typology Code available for payment source | Admitting: Family Medicine

## 2023-11-20 ENCOUNTER — Encounter: Payer: Self-pay | Admitting: Family Medicine

## 2023-11-20 VITALS — BP 148/88 | HR 86 | Ht 70.0 in | Wt 207.0 lb

## 2023-11-20 DIAGNOSIS — I10 Essential (primary) hypertension: Secondary | ICD-10-CM

## 2023-11-20 MED ORDER — LOSARTAN POTASSIUM-HCTZ 100-25 MG PO TABS: 1.0000 | ORAL_TABLET | Freq: Every day | ORAL | 3 refills | Status: DC

## 2023-11-20 NOTE — Assessment & Plan Note (Addendum)
 Vitals:   11/20/23 1022 11/20/23 1029  BP: (!) 151/94 (!) 148/88  Increased Losartan -hydrochlorothiazide 100-25 mg once daily Continue Amlodipine 10 mg once daily Continued discussion on DASH diet, low sodium diet and maintain a exercise routine for 150 minutes per week.  Follow up with at home blood pressure via my chart to monitor trends

## 2023-12-02 ENCOUNTER — Other Ambulatory Visit: Payer: Self-pay | Admitting: Orthopedic Surgery

## 2023-12-02 DIAGNOSIS — G90522 Complex regional pain syndrome I of left lower limb: Secondary | ICD-10-CM

## 2023-12-02 DIAGNOSIS — S9032XS Contusion of left foot, sequela: Secondary | ICD-10-CM

## 2023-12-05 DIAGNOSIS — Z008 Encounter for other general examination: Secondary | ICD-10-CM | POA: Diagnosis not present

## 2023-12-05 DIAGNOSIS — I10 Essential (primary) hypertension: Secondary | ICD-10-CM | POA: Diagnosis not present

## 2024-01-22 ENCOUNTER — Encounter: Payer: Self-pay | Admitting: Orthopedic Surgery

## 2024-01-22 ENCOUNTER — Ambulatory Visit: Payer: No Typology Code available for payment source | Admitting: Orthopedic Surgery

## 2024-01-22 DIAGNOSIS — E1165 Type 2 diabetes mellitus with hyperglycemia: Secondary | ICD-10-CM | POA: Diagnosis not present

## 2024-01-22 DIAGNOSIS — M6702 Short Achilles tendon (acquired), left ankle: Secondary | ICD-10-CM | POA: Diagnosis not present

## 2024-01-22 DIAGNOSIS — M109 Gout, unspecified: Secondary | ICD-10-CM | POA: Diagnosis not present

## 2024-01-22 NOTE — Progress Notes (Signed)
 Office Visit Note   Patient: William Chapman           Date of Birth: 09-07-1966           MRN: [id number] Visit Date: 01/22/2024              Requested by: Rica Records, FNP [phone] [place],  [place] [address] PCP: Rica Records, FNP  Chief Complaint  Patient presents with   Right Foot - Follow-up      HPI: Patient is a 58 year old gentleman who is seen in follow-up for 3 separate issues.  #1 gout currently on allopurinol and colchicine, #2 forefoot pain with pain across the forefoot.  #3 Achilles tightness.  Assessment & Plan: Visit Diagnoses:  1. Gout of right foot, unspecified cause, unspecified chronicity   2. Type 2 diabetes mellitus with hyperglycemia, without long-term current use of insulin (HCC)   3. Contracture of left Achilles tendon     Plan: Recommended Achilles stretching this was demonstrated.  Recommended a stiff soled sneaker.  At follow-up repeat uric acid.  Follow-Up Instructions: Return in about 3 months (around 04/23/2024).   Ortho Exam  Patient is alert, oriented, no adenopathy, well-dressed, normal affect, normal respiratory effort. Examination patient has no dystrophic changes to the left foot.  He has pain across the midfoot to palpation.  With his knee extended he has dorsiflexion only to neutral.  There is no acute redness or cellulitis no acute gout.  Imaging: No results found. No images are attached to the encounter.  Labs: Lab Results  Component Value Date   HGBA1C 7.6 (H) 09/25/2023   LABURIC 8.3 (H) 09/25/2023     Lab Results  Component Value Date   ALBUMIN 4.4 10/21/2023   ALBUMIN 3.4 (L) 08/20/2018   ALBUMIN 3.9 08/19/2018    No results found for: "MG" Lab Results  Component Value Date   VD25OH 13.2 (L) 10/21/2023    No results found for: "PREALBUMIN"    Latest Ref Rng & Units 10/21/2023    9:46 AM 08/20/2018    5:30 AM 08/19/2018    8:18 PM  CBC EXTENDED  WBC 3.4 - 10.8 x10E3/uL 5.5   5.3  5.8   RBC 4.14 - 5.80 x10E6/uL 4.47  3.77  3.92   Hemoglobin 13.0 - 17.7 g/dL 47.8  29.5  62.1   HCT 37.5 - 51.0 % 43.1  36.9  37.8   Platelets 150 - 450 x10E3/uL 288  222  235   NEUT# 1.4 - 7.0 x10E3/uL 2.6   2.8   Lymph# 0.7 - 3.1 x10E3/uL 2.2   2.4      There is no height or weight on file to calculate BMI.  Orders:  Orders Placed This Encounter  Procedures   Uric acid   No orders of the defined types were placed in this encounter.    Procedures: No procedures performed  Clinical Data: No additional findings.  ROS:  All other systems negative, except as noted in the HPI. Review of Systems  Objective: Vital Signs: There were no vitals taken for this visit.  Specialty Comments:  No specialty comments available.  PMFS History: Patient Active Problem List   Diagnosis Date Noted   Hypertension 10/21/2023   Type 2 diabetes mellitus (HCC) 10/21/2023   Contusion of left foot 07/26/2020   Syncope and collapse 08/19/2018   Past Medical History:  Diagnosis Date   Depression    Hyperlipidemia  Hypertension    Type 2 diabetes mellitus (HCC)     Family History  Problem Relation Age of Onset   Healthy Mother    COPD Father     History reviewed. No pertinent surgical history. Social History   Occupational History   Occupation: disabled.  Tobacco Use   Smoking status: Every Day    Current packs/day: 0.50    Types: Cigarettes   Smokeless tobacco: Never  Vaping Use   Vaping status: Never Used  Substance and Sexual Activity   Alcohol use: Yes    Comment: liquor 3x week   Drug use: Not Currently   Sexual activity: Not Currently    Birth control/protection: None

## 2024-01-23 ENCOUNTER — Telehealth: Payer: Self-pay | Admitting: Orthopedic Surgery

## 2024-01-23 LAB — URIC ACID: Uric Acid, Serum: 7.4 mg/dL (ref 4.0–8.0)

## 2024-01-23 NOTE — Telephone Encounter (Signed)
 LM on VM with results okay per [facility]

## 2024-01-23 NOTE — Telephone Encounter (Signed)
-----   Message from Nadara Mustard sent at 01/23/2024  7:05 AM EST ----- Call patient.  Uric acid improved at 7.4.  Continue current medication.  Ideally we would like this below 6. ----- Message ----- From: Interface, [facility] Lab Results In Sent: 01/23/2024   2:48 AM EST To: Nadara Mustard, MD

## 2024-01-26 ENCOUNTER — Other Ambulatory Visit: Payer: Self-pay | Admitting: Orthopedic Surgery

## 2024-01-26 ENCOUNTER — Other Ambulatory Visit: Payer: Self-pay | Admitting: Family Medicine

## 2024-01-26 DIAGNOSIS — S9032XS Contusion of left foot, sequela: Secondary | ICD-10-CM

## 2024-01-26 DIAGNOSIS — G90522 Complex regional pain syndrome I of left lower limb: Secondary | ICD-10-CM

## 2024-01-26 NOTE — Telephone Encounter (Signed)
 Copied from CRM [id number]. Topic: Clinical - Medication Refill >> Jan 26, 2024 11:15 AM Charletta Cousin wrote: William Chapman  Pharmacist Guide with [facility] [phone] ext [phone] Okay to call patient back with an update    Medication: metFORMIN (GLUCOPHAGE) 500 MG tablet  Has the patient contacted their pharmacy? Yes, Contact PCP office. Pharmacy states they reached out last week. Caller requesting for request to be expedited.    Is this the correct pharmacy for this prescription? Yes   This is the patient's preferred pharmacy:  [facility] [address], [place] - [address] [place] [address] [place] [address] Phone: [phone] Fax: [phone]   Has the prescription been filled recently? Yes  Is the patient out of the medication? Yes  Has the patient been seen for an appointment in the last year OR does the patient have an upcoming appointment? Yes  Can we respond through MyChart? No  Agent: Please be advised that Rx refills may take up to 3 business days. We ask that you follow-up with your pharmacy.

## 2024-01-28 DIAGNOSIS — D485 Neoplasm of uncertain behavior of skin: Secondary | ICD-10-CM | POA: Diagnosis not present

## 2024-01-28 DIAGNOSIS — L814 Other melanin hyperpigmentation: Secondary | ICD-10-CM | POA: Diagnosis not present

## 2024-01-28 DIAGNOSIS — D1801 Hemangioma of skin and subcutaneous tissue: Secondary | ICD-10-CM | POA: Diagnosis not present

## 2024-01-28 DIAGNOSIS — I781 Nevus, non-neoplastic: Secondary | ICD-10-CM | POA: Diagnosis not present

## 2024-01-28 DIAGNOSIS — D225 Melanocytic nevi of trunk: Secondary | ICD-10-CM | POA: Diagnosis not present

## 2024-01-29 ENCOUNTER — Other Ambulatory Visit: Payer: Self-pay | Admitting: Family Medicine

## 2024-02-11 DIAGNOSIS — L03011 Cellulitis of right finger: Secondary | ICD-10-CM | POA: Diagnosis not present

## 2024-02-19 ENCOUNTER — Ambulatory Visit (INDEPENDENT_AMBULATORY_CARE_PROVIDER_SITE_OTHER): Payer: Self-pay | Admitting: Family Medicine

## 2024-02-19 ENCOUNTER — Encounter: Payer: Self-pay | Admitting: Family Medicine

## 2024-02-19 VITALS — BP 135/82 | HR 96 | Ht 70.0 in | Wt 206.0 lb

## 2024-02-19 DIAGNOSIS — Z7984 Long term (current) use of oral hypoglycemic drugs: Secondary | ICD-10-CM | POA: Diagnosis not present

## 2024-02-19 DIAGNOSIS — E119 Type 2 diabetes mellitus without complications: Secondary | ICD-10-CM | POA: Diagnosis not present

## 2024-02-19 DIAGNOSIS — E559 Vitamin D deficiency, unspecified: Secondary | ICD-10-CM | POA: Diagnosis not present

## 2024-02-19 DIAGNOSIS — F419 Anxiety disorder, unspecified: Secondary | ICD-10-CM

## 2024-02-19 DIAGNOSIS — I10 Essential (primary) hypertension: Secondary | ICD-10-CM | POA: Diagnosis not present

## 2024-02-19 MED ORDER — METFORMIN HCL 500 MG PO TABS: 500.0000 mg | ORAL_TABLET | Freq: Two times a day (BID) | ORAL | 0 refills | Status: DC

## 2024-02-19 MED ORDER — BUSPIRONE HCL 10 MG PO TABS: 10.0000 mg | ORAL_TABLET | Freq: Two times a day (BID) | ORAL | 3 refills | Status: DC

## 2024-02-19 MED ORDER — METFORMIN HCL 500 MG PO TABS
500.0000 mg | ORAL_TABLET | Freq: Two times a day (BID) | ORAL | 1 refills | Status: DC
Start: 2024-02-19 — End: 2024-06-10

## 2024-02-19 NOTE — Assessment & Plan Note (Signed)
 Last Hemoglobin A1c: 7.6 Labs: Ordered today, results pending; will follow up accordingly. The patient reports adhering to prescribed medications: Metformin 500 mg00 mg twice daily  Reviewed non-pharmacological interventions, including a balanced diet rich in lean proteins, healthy fats, whole grains, and high-fiber vegetables. Emphasized reducing refined sugars and processed carbohydrates, and incorporating more fruits, leafy greens, and legumes. Education: Patient was educated on recognizing signs and symptoms of both hypoglycemia and hyperglycemia, and advised to seek emergency care if these symptoms occur. Follow-Up: Scheduled for follow-up in 3-4 months, or sooner if needed. Patient Understanding: The patient verbalized understanding of the care plan, and all questions were answered. Additional Care: Ophthalmology exam current. Foot exam results were within normal limits.

## 2024-02-19 NOTE — Assessment & Plan Note (Signed)
 Patient reports constant evere anxiety when driving, to the point he has to pull over Advise to continue Effexor 150 mg once daily Start Buspar 10 mg twice daily Advise on Key Considerations Before Driving on Buspirone: Start low and monitor effects - If you're new to buspirone, wait a few days to see how your body reacts before driving. Avoid alcohol or sedatives - These can increase drowsiness and impair driving ability. Watch for dizziness or coordination issues - Especially when standing up or moving quickly. If you experience dizziness, lightheadedness, or drowsiness, avoid driving until you know how the medication affects you.

## 2024-02-19 NOTE — Assessment & Plan Note (Addendum)
 Continue Losartan 100 mg once daily, amlodipine 10 mg once daily Labs ordered. Discussed with  patient to monitor their blood pressure regularly and maintain a heart-healthy diet rich in fruits, vegetables, whole grains, and low-fat dairy, while reducing sodium intake to less than 2,300 mg per day. Regular physical activity, such as 30 minutes of moderate exercise most days of the week, will help lower blood pressure and improve overall cardiovascular health. Avoiding smoking, limiting alcohol consumption, and managing stress. Take  prescribed medication, & take it as directed and avoid skipping doses. Seek emergency care if your blood pressure is (over 180/100) or you experience chest pain, shortness of breath, or sudden vision changes.Patient verbalizes understanding regarding plan of care and all questions answered.

## 2024-02-19 NOTE — Patient Instructions (Addendum)
        Great to see you today.  I have refilled the medication(s) we provide.    Key Considerations Before Driving on Buspirone: ? If you're used to the medication - If you've been taking buspirone for a while and don't experience drowsiness or dizziness, driving is usually safe. ? Start low and monitor effects - If you're new to buspirone, wait a few days to see how your body reacts before driving. ? Avoid alcohol or sedatives - These can increase drowsiness and impair driving ability. ? Watch for dizziness or coordination issues - Especially when standing up or moving quickly.  If you experience dizziness, lightheadedness, or drowsiness, avoid driving until you know how the medication affects you.     If labs were collected, we will inform you of lab results once received either by echart message or telephone call.   - echart message- for normal results that have been seen by the patient already.   - telephone call: abnormal results or if patient has not viewed results in their echart.   - Please take medications as prescribed. - Follow up with your primary health provider if any health concerns arises. - If symptoms worsen please contact your primary care provider and/or visit the emergency department.

## 2024-02-19 NOTE — Progress Notes (Signed)
 Established Patient Office Visit   Subjective  Patient ID: William Chapman, male    DOB: 21-Jan-1966  Age: 58 y.o. MRN: [id number]  Chief Complaint  Patient presents with   Follow-up    4 Month HTN/ DM f/u   Experiencing severe anxiety when driving, to the point he has to pull over     He  has a past medical history of Depression, Hyperlipidemia, Hypertension, and Type 2 diabetes mellitus (HCC).  HPI Patient presents to the clinic for chronic follow up. For the details of today's visit, please refer to assessment and plan.    Review of Systems  Constitutional:  Negative for chills and fever.  Eyes:  Negative for blurred vision.  Respiratory:  Negative for shortness of breath.   Cardiovascular:  Negative for chest pain.  Neurological:  Negative for dizziness and headaches.  Psychiatric/Behavioral:  The patient is nervous/anxious.       Objective:     BP 135/82   Pulse 96   Ht 5\' 10"  (1.778 m)   Wt 206 lb 0.6 oz (93.5 kg)   SpO2 96%   BMI 29.56 kg/m  BP Readings from Last 3 Encounters:  02/19/24 135/82  11/20/23 (!) 148/88  10/21/23 (!) 158/87      Physical Exam Vitals reviewed.  Constitutional:      General: He is not in acute distress.    Appearance: Normal appearance. He is not ill-appearing, toxic-appearing or diaphoretic.  HENT:     Head: Normocephalic.     Right Ear: Tympanic membrane normal.     Left Ear: Tympanic membrane normal.     Mouth/Throat:     Mouth: Mucous membranes are moist.  Eyes:     General:        Right eye: No discharge.        Left eye: No discharge.     Conjunctiva/sclera: Conjunctivae normal.     Pupils: Pupils are equal, round, and reactive to light.  Cardiovascular:     Rate and Rhythm: Normal rate.     Pulses: Normal pulses.     Heart sounds: Normal heart sounds.  Pulmonary:     Effort: Pulmonary effort is normal. No respiratory distress.     Breath sounds: Normal breath sounds.  Abdominal:     General: Bowel sounds  are normal.     Palpations: Abdomen is soft.     Tenderness: There is no abdominal tenderness. There is no right CVA tenderness, left CVA tenderness or guarding.  Skin:    General: Skin is warm and dry.     Capillary Refill: Capillary refill takes less than 2 seconds.  Neurological:     Mental Status: He is alert.  Psychiatric:        Mood and Affect: Mood normal.        Behavior: Behavior normal.      No results found for any visits on 02/19/24.  The 10-year ASCVD risk score (Arnett DK, et al., 2019) is: 35%    Assessment & Plan:  Primary hypertension Assessment & Plan: Continue Losartan 100 mg once daily, amlodipine 10 mg once daily Labs ordered. Discussed with  patient to monitor their blood pressure regularly and maintain a heart-healthy diet rich in fruits, vegetables, whole grains, and low-fat dairy, while reducing sodium intake to less than 2,300 mg per day. Regular physical activity, such as 30 minutes of moderate exercise most days of the week, will help lower blood pressure and improve overall cardiovascular  health. Avoiding smoking, limiting alcohol consumption, and managing stress. Take  prescribed medication, & take it as directed and avoid skipping doses. Seek emergency care if your blood pressure is (over 180/100) or you experience chest pain, shortness of breath, or sudden vision changes.Patient verbalizes understanding regarding plan of care and all questions answered.     Orders: -     BMP8+eGFR -     Lipid panel -     CBC with Differential/Platelet  Type 2 diabetes mellitus without complication, without long-term current use of insulin (HCC) Assessment & Plan: Last Hemoglobin A1c: 7.6 Labs: Ordered today, results pending; will follow up accordingly. The patient reports adhering to prescribed medications: Metformin 500 mg00 mg twice daily  Reviewed non-pharmacological interventions, including a balanced diet rich in lean proteins, healthy fats, whole grains,  and high-fiber vegetables. Emphasized reducing refined sugars and processed carbohydrates, and incorporating more fruits, leafy greens, and legumes. Education: Patient was educated on recognizing signs and symptoms of both hypoglycemia and hyperglycemia, and advised to seek emergency care if these symptoms occur. Follow-Up: Scheduled for follow-up in 3-4 months, or sooner if needed. Patient Understanding: The patient verbalized understanding of the care plan, and all questions were answered. Additional Care: Ophthalmology exam current. Foot exam results were within normal limits.   Orders: -     Hemoglobin A1c  Anxiety Assessment & Plan: Patient reports constant evere anxiety when driving, to the point he has to pull over Advise to continue Effexor 150 mg once daily Start Buspar 10 mg twice daily Advise on Key Considerations Before Driving on Buspirone: Start low and monitor effects - If you're new to buspirone, wait a few days to see how your body reacts before driving. Avoid alcohol or sedatives - These can increase drowsiness and impair driving ability. Watch for dizziness or coordination issues - Especially when standing up or moving quickly. If you experience dizziness, lightheadedness, or drowsiness, avoid driving until you know how the medication affects you.   Vitamin D deficiency -     VITAMIN D 25 Hydroxy (Vit-D Deficiency, Fractures)  Other orders -     busPIRone HCl; Take 1 tablet (10 mg total) by mouth 2 (two) times daily.  Dispense: 60 tablet; Refill: 3 -     metFORMIN HCl; Take 1 tablet (500 mg total) by mouth 2 (two) times daily with a meal.  Dispense: 180 tablet; Refill: 1    Return in about 4 months (around 06/20/2024), or if symptoms worsen or fail to improve, for chronic follow-up.   Cruzita Lederer Newman Nip, FNP

## 2024-02-20 ENCOUNTER — Encounter: Payer: Self-pay | Admitting: Family Medicine

## 2024-02-20 LAB — LIPID PANEL
Chol/HDL Ratio: 3.2 ratio (ref 0.0–5.0)
Cholesterol, Total: 189 mg/dL (ref 100–199)
HDL: 60 mg/dL (ref >39)
LDL Chol Calc (NIH): 87 mg/dL (ref 0–99)
Triglycerides: 254 mg/dL — ABNORMAL HIGH (ref 0–149)
VLDL Cholesterol Cal: 42 mg/dL — ABNORMAL HIGH (ref 5–40)

## 2024-02-20 LAB — CBC WITH DIFFERENTIAL/PLATELET
Basophils Absolute: 0.1 10*3/uL (ref 0.0–0.2)
Basos: 1 %
EOS (ABSOLUTE): 0.4 10*3/uL (ref 0.0–0.4)
Eos: 5 %
Hematocrit: 41.3 % (ref 37.5–51.0)
Hemoglobin: 14.3 g/dL (ref 13.0–17.7)
Immature Grans (Abs): 0.1 10*3/uL (ref 0.0–0.1)
Immature Granulocytes: 1 %
Lymphocytes Absolute: 2.4 10*3/uL (ref 0.7–3.1)
Lymphs: 29 %
MCH: 33.1 pg — ABNORMAL HIGH (ref 26.6–33.0)
MCHC: 34.6 g/dL (ref 31.5–35.7)
MCV: 96 fL (ref 79–97)
Monocytes Absolute: 0.5 10*3/uL (ref 0.1–0.9)
Monocytes: 6 %
Neutrophils Absolute: 4.9 10*3/uL (ref 1.4–7.0)
Neutrophils: 58 %
Platelets: 301 10*3/uL (ref 150–450)
RBC: 4.32 x10E6/uL (ref 4.14–5.80)
RDW: 13.1 % (ref 11.6–15.4)
WBC: 8.4 10*3/uL (ref 3.4–10.8)

## 2024-02-20 LAB — BMP8+EGFR
BUN/Creatinine Ratio: 12 (ref 9–20)
BUN: 9 mg/dL (ref 6–24)
CO2: 25 mmol/L (ref 20–29)
Calcium: 10.5 mg/dL — ABNORMAL HIGH (ref 8.7–10.2)
Chloride: 95 mmol/L — ABNORMAL LOW (ref 96–106)
Creatinine, Ser: 0.75 mg/dL — ABNORMAL LOW (ref 0.76–1.27)
Glucose: 118 mg/dL — ABNORMAL HIGH (ref 70–99)
Potassium: 3.9 mmol/L (ref 3.5–5.2)
Sodium: 138 mmol/L (ref 134–144)
eGFR: 105 mL/min/{1.73_m2} (ref >59)

## 2024-02-20 LAB — HEMOGLOBIN A1C
Est. average glucose Bld gHb Est-mCnc: 134 mg/dL
Hgb A1c MFr Bld: 6.3 % — ABNORMAL HIGH (ref 4.8–5.6)

## 2024-02-20 LAB — VITAMIN D 25 HYDROXY (VIT D DEFICIENCY, FRACTURES): Vit D, 25-Hydroxy: 22.7 ng/mL — ABNORMAL LOW (ref 30.0–100.0)

## 2024-03-18 ENCOUNTER — Other Ambulatory Visit: Payer: Self-pay | Admitting: Family Medicine

## 2024-03-19 DIAGNOSIS — R070 Pain in throat: Secondary | ICD-10-CM | POA: Diagnosis not present

## 2024-03-19 DIAGNOSIS — B349 Viral infection, unspecified: Secondary | ICD-10-CM | POA: Diagnosis not present

## 2024-03-19 DIAGNOSIS — Z20822 Contact with and (suspected) exposure to covid-19: Secondary | ICD-10-CM | POA: Diagnosis not present

## 2024-03-23 ENCOUNTER — Other Ambulatory Visit: Payer: Self-pay | Admitting: Family Medicine

## 2024-03-23 ENCOUNTER — Telehealth: Payer: Self-pay

## 2024-03-23 ENCOUNTER — Telehealth: Payer: Self-pay | Admitting: Family Medicine

## 2024-03-23 MED ORDER — GLIPIZIDE 5 MG PO TABS: 5.0000 mg | ORAL_TABLET | Freq: Two times a day (BID) | ORAL | 3 refills | Status: DC

## 2024-03-23 MED ORDER — PROPRANOLOL HCL 20 MG PO TABS: 20.0000 mg | ORAL_TABLET | Freq: Two times a day (BID) | ORAL | 2 refills | Status: DC | PRN

## 2024-03-23 NOTE — Telephone Encounter (Signed)
 Sent in glipizde 5 mg twice daily as alternative

## 2024-03-23 NOTE — Telephone Encounter (Signed)
Sent in propranolol

## 2024-03-23 NOTE — Telephone Encounter (Signed)
 Left detailed vm for pt.

## 2024-03-23 NOTE — Telephone Encounter (Signed)
 Copied from CRM [id number]. Topic: Clinical - Prescription Issue >> Mar 23, 2024 11:10 AM William Chapman wrote: Reason for CRM: Pt would like to change metFORMIN  (GLUCOPHAGE ) 500 MG tablet due to making his stomach upset. Pt would like a call back at [phone]

## 2024-03-23 NOTE — Telephone Encounter (Signed)
 Copied from CRM [id number]. Topic: Clinical - Medication Question >> Mar 22, 2024 12:25 PM Tiffany S wrote: Reason for CRM: busPIRone  (BUSPAR ) 10 MG tablet [[id number]]  Patient said he has been taking the medication for 3 weeks and it not working it makes him panic more patient would like to try a different medication

## 2024-03-24 ENCOUNTER — Other Ambulatory Visit: Payer: Self-pay | Admitting: Family Medicine

## 2024-03-24 ENCOUNTER — Other Ambulatory Visit: Payer: Self-pay | Admitting: Orthopedic Surgery

## 2024-03-24 DIAGNOSIS — S9032XS Contusion of left foot, sequela: Secondary | ICD-10-CM

## 2024-03-24 DIAGNOSIS — G90522 Complex regional pain syndrome I of left lower limb: Secondary | ICD-10-CM

## 2024-03-25 NOTE — Telephone Encounter (Signed)
 Lvm of the new medication replacement

## 2024-04-22 ENCOUNTER — Other Ambulatory Visit: Payer: Self-pay | Admitting: Family Medicine

## 2024-04-22 ENCOUNTER — Ambulatory Visit: Admitting: Orthopedic Surgery

## 2024-04-22 ENCOUNTER — Other Ambulatory Visit (INDEPENDENT_AMBULATORY_CARE_PROVIDER_SITE_OTHER): Payer: Self-pay

## 2024-04-22 DIAGNOSIS — S9032XD Contusion of left foot, subsequent encounter: Secondary | ICD-10-CM

## 2024-04-22 DIAGNOSIS — S9032XS Contusion of left foot, sequela: Secondary | ICD-10-CM

## 2024-04-22 MED ORDER — ALBUTEROL SULFATE HFA 108 (90 BASE) MCG/ACT IN AERS: 1.0000 | INHALATION_SPRAY | Freq: Four times a day (QID) | RESPIRATORY_TRACT | 0 refills | Status: DC | PRN

## 2024-04-22 NOTE — Telephone Encounter (Signed)
 Copied from CRM [id number]. Topic: Clinical - Medication Refill >> Apr 22, 2024  2:56 PM Emylou G wrote: Medication: albuterol  (VENTOLIN  HFA) 108 (90 Base) MCG/ACT inhaler    Has the patient contacted their pharmacy? Yes (Agent: If no, request that the patient contact the pharmacy for the refill. If patient does not wish to contact the pharmacy document the reason why and proceed with request.) (Agent: If yes, when and what did the pharmacy advise?)  This is the patient's preferred pharmacy:  [facility] [address] [place] - [address] [place] [address] Phone: [phone] Fax: [phone]  Is this the correct pharmacy for this prescription? Yes If no, delete pharmacy and type the correct one.   Has the prescription been filled recently? No  Is the patient out of the medication? Yes  Has the patient been seen for an appointment in the last year OR does the patient have an upcoming appointment? Yes  Can we respond through MyChart? Yes  Agent: Please be advised that Rx refills may take up to 3 business days. We ask that you follow-up with your pharmacy.

## 2024-04-23 ENCOUNTER — Encounter: Payer: Self-pay | Admitting: Orthopedic Surgery

## 2024-04-23 NOTE — Progress Notes (Signed)
 Office Visit Note   Patient: William Chapman           Date of Birth: 1965/12/30           MRN: [id number] Visit Date: 04/22/2024              Requested by: Rosanna Comment, FNP [phone] [address] [place],  [place] [address] PCP: Rosanna Comment, FNP  Chief Complaint  Patient presents with   Right Foot - Follow-up      HPI: Patient is a 58 year old gentleman who injured his left foot at work.  He states he dropped a weight on his foot.  Patient states it still painful for ambulation across the midfoot.  Patient has been on colchicine .  Assessment & Plan: Visit Diagnoses:  1. Contusion of left foot, sequela     Plan: Patient has a contusion across the Lisfranc joint we will place him in a postoperative shoe.  At follow-up obtain three-view radiographs of the left foot.  Follow-Up Instructions: Return in about 4 weeks (around 05/20/2024).   Ortho Exam  Patient is alert, oriented, no adenopathy, well-dressed, normal affect, normal respiratory effort. Examination patient has a palpable pulse.  There is no redness or cellulitis.  The peroneal and posterior tibial tendon are nontender to palpation.  The metatarsal heads are not tender to palpation patient is point tender to palpation and with distraction across the Lisfranc complex.  There is no skin breakdown or bruising.    Imaging: No results found. No images are attached to the encounter.  Labs: Lab Results  Component Value Date   HGBA1C 6.3 (H) 02/19/2024   HGBA1C 7.6 (H) 09/25/2023   LABURIC 7.4 01/22/2024   LABURIC 8.3 (H) 09/25/2023     Lab Results  Component Value Date   ALBUMIN 4.4 10/21/2023   ALBUMIN 3.4 (L) 08/20/2018   ALBUMIN 3.9 08/19/2018    No results found for: "MG" Lab Results  Component Value Date   VD25OH 22.7 (L) 02/19/2024   VD25OH 13.2 (L) 10/21/2023    No results found for: "PREALBUMIN"    Latest Ref Rng & Units 02/19/2024   10:22 AM 10/21/2023    9:46 AM  08/20/2018    5:30 AM  CBC EXTENDED  WBC 3.4 - 10.8 x10E3/uL 8.4  5.5  5.3   RBC 4.14 - 5.80 x10E6/uL 4.32  4.47  3.77   Hemoglobin 13.0 - 17.7 g/dL 47.8  29.5  62.1   HCT 37.5 - 51.0 % 41.3  43.1  36.9   Platelets 150 - 450 x10E3/uL 301  288  222   NEUT# 1.4 - 7.0 x10E3/uL 4.9  2.6    Lymph# 0.7 - 3.1 x10E3/uL 2.4  2.2       There is no height or weight on file to calculate BMI.  Orders:  Orders Placed This Encounter  Procedures   XR Foot Complete Left   No orders of the defined types were placed in this encounter.    Procedures: No procedures performed  Clinical Data: No additional findings.  ROS:  All other systems negative, except as noted in the HPI. Review of Systems  Objective: Vital Signs: There were no vitals taken for this visit.  Specialty Comments:  No specialty comments available.  PMFS History: Patient Active Problem List   Diagnosis Date Noted   Anxiety 02/19/2024   Hypertension 10/21/2023   Type 2 diabetes mellitus (HCC) 10/21/2023   Contusion of left foot  07/26/2020   Syncope and collapse 08/19/2018   Past Medical History:  Diagnosis Date   Depression    Hyperlipidemia    Hypertension    Type 2 diabetes mellitus (HCC)     Family History  Problem Relation Age of Onset   Healthy Mother    COPD Father     History reviewed. No pertinent surgical history. Social History   Occupational History   Occupation: disabled.  Tobacco Use   Smoking status: Every Day    Current packs/day: 0.25    Types: Cigarettes   Smokeless tobacco: Never  Vaping Use   Vaping status: Never Used  Substance and Sexual Activity   Alcohol use: Yes    Comment: 4-5 drinks only on the weekend   Drug use: Not Currently   Sexual activity: Not Currently    Birth control/protection: None

## 2024-05-05 ENCOUNTER — Telehealth: Payer: Self-pay | Admitting: Family Medicine

## 2024-05-05 NOTE — Progress Notes (Signed)
   05/05/2024  Patient ID: William Chapman, male   DOB: 02-20-1966, 58 y.o.   MRN: [id number]  Pharmacy Quality Measure Review  This patient is appearing on a report for being at risk of failing the adherence measure for diabetes medications this calendar year.   Medication: metformin   Reviewed recent notes, metformin  d/c d/t GI side effects. Started on glipizide . Will fail quality measure d/t two fills of metformin  this year. No further action needed.   Rolando Cliche, PharmD, BCGP Clinical Pharmacist  [phone]

## 2024-05-05 NOTE — Telephone Encounter (Signed)
 Called patient left a voicemail to reach our office to schedule a diabetic eye exam.

## 2024-05-16 DIAGNOSIS — J189 Pneumonia, unspecified organism: Secondary | ICD-10-CM | POA: Diagnosis not present

## 2024-05-16 DIAGNOSIS — R0602 Shortness of breath: Secondary | ICD-10-CM | POA: Diagnosis not present

## 2024-05-16 DIAGNOSIS — R918 Other nonspecific abnormal finding of lung field: Secondary | ICD-10-CM | POA: Diagnosis not present

## 2024-05-16 DIAGNOSIS — Z20822 Contact with and (suspected) exposure to covid-19: Secondary | ICD-10-CM | POA: Diagnosis not present

## 2024-05-16 DIAGNOSIS — F17210 Nicotine dependence, cigarettes, uncomplicated: Secondary | ICD-10-CM | POA: Diagnosis not present

## 2024-05-16 DIAGNOSIS — M940 Chondrocostal junction syndrome [Tietze]: Secondary | ICD-10-CM | POA: Diagnosis not present

## 2024-05-16 DIAGNOSIS — Z720 Tobacco use: Secondary | ICD-10-CM | POA: Diagnosis not present

## 2024-05-16 DIAGNOSIS — Z91041 Radiographic dye allergy status: Secondary | ICD-10-CM | POA: Diagnosis not present

## 2024-05-16 DIAGNOSIS — F32A Depression, unspecified: Secondary | ICD-10-CM | POA: Diagnosis not present

## 2024-05-16 DIAGNOSIS — F419 Anxiety disorder, unspecified: Secondary | ICD-10-CM | POA: Diagnosis not present

## 2024-05-16 DIAGNOSIS — J9811 Atelectasis: Secondary | ICD-10-CM | POA: Diagnosis not present

## 2024-05-24 ENCOUNTER — Ambulatory Visit: Admitting: Orthopedic Surgery

## 2024-06-01 ENCOUNTER — Ambulatory Visit: Payer: Self-pay

## 2024-06-01 ENCOUNTER — Inpatient Hospital Stay: Payer: Self-pay | Admitting: Nurse Practitioner

## 2024-06-10 ENCOUNTER — Ambulatory Visit: Payer: Self-pay

## 2024-06-10 ENCOUNTER — Ambulatory Visit: Payer: Self-pay | Admitting: Family Medicine

## 2024-06-10 ENCOUNTER — Other Ambulatory Visit: Payer: Self-pay | Admitting: Family Medicine

## 2024-06-10 ENCOUNTER — Ambulatory Visit: Admitting: Orthopedic Surgery

## 2024-06-10 ENCOUNTER — Encounter: Payer: Self-pay | Admitting: Family Medicine

## 2024-06-10 ENCOUNTER — Other Ambulatory Visit (INDEPENDENT_AMBULATORY_CARE_PROVIDER_SITE_OTHER): Payer: Self-pay

## 2024-06-10 VITALS — BP 145/90 | HR 72 | Ht 70.0 in | Wt 202.0 lb

## 2024-06-10 DIAGNOSIS — F419 Anxiety disorder, unspecified: Secondary | ICD-10-CM

## 2024-06-10 DIAGNOSIS — Z09 Encounter for follow-up examination after completed treatment for conditions other than malignant neoplasm: Secondary | ICD-10-CM

## 2024-06-10 DIAGNOSIS — Z7984 Long term (current) use of oral hypoglycemic drugs: Secondary | ICD-10-CM

## 2024-06-10 DIAGNOSIS — R053 Chronic cough: Secondary | ICD-10-CM

## 2024-06-10 DIAGNOSIS — E038 Other specified hypothyroidism: Secondary | ICD-10-CM

## 2024-06-10 DIAGNOSIS — S9032XS Contusion of left foot, sequela: Secondary | ICD-10-CM

## 2024-06-10 DIAGNOSIS — M79672 Pain in left foot: Secondary | ICD-10-CM | POA: Diagnosis not present

## 2024-06-10 DIAGNOSIS — E1169 Type 2 diabetes mellitus with other specified complication: Secondary | ICD-10-CM

## 2024-06-10 DIAGNOSIS — I10 Essential (primary) hypertension: Secondary | ICD-10-CM

## 2024-06-10 DIAGNOSIS — F32A Depression, unspecified: Secondary | ICD-10-CM | POA: Diagnosis not present

## 2024-06-10 DIAGNOSIS — E119 Type 2 diabetes mellitus without complications: Secondary | ICD-10-CM

## 2024-06-10 LAB — HM DIABETES EYE EXAM

## 2024-06-10 NOTE — Assessment & Plan Note (Signed)
 BMP and CBC were ordered. The hospital chart, including the discharge summary, was thoroughly reviewed. Medications were carefully reviewed and reconciled with the patient. A chest X-ray was ordered for follow-up of hospital-treated pneumonia (PNA).

## 2024-06-10 NOTE — Patient Instructions (Signed)

## 2024-06-10 NOTE — Assessment & Plan Note (Addendum)
 Last Hemoglobin A1c: 6.3 Labs: Ordered today, results pending; will follow up accordingly. The patient reports adhering to prescribed medications: Glipizide  5 mg twice daily, D/C metformin  due to side effects     Reviewed non-pharmacological interventions, including a balanced diet rich in lean proteins, healthy fats, whole grains, and high-fiber vegetables. Emphasized reducing refined sugars and processed carbohydrates, and incorporating more fruits, leafy greens, and legumes. Education: Patient was educated on recognizing signs and symptoms of both hypoglycemia and hyperglycemia, and advised to seek emergency care if these symptoms occur. Follow-Up: Scheduled for follow-up in 4 months, or sooner if needed. Patient Understanding: The patient verbalized understanding of the care plan, and all questions were answered. Additional Care: Ophthalmology exam current. Foot exam results were within normal limits.

## 2024-06-10 NOTE — Assessment & Plan Note (Signed)
 Vitals:   06/10/24 1028 06/10/24 1115  BP: (!) 152/103 (!) 145/90  Not controlled in today's visit Follow up in 2 weeks to recheck blood pressure reading Continue Losartan  100 mg once daily, amlodipine 10 mg once daily  Labs ordered Continued discussion on DASH diet, low sodium diet and maintain a exercise routine for 150 minutes per week.

## 2024-06-10 NOTE — Progress Notes (Signed)
 Arrived on 06/10/2024 and has given verbal consent to obtain images and complete their overdue diabetic retinal screening.  The images have been sent to an ophthalmologist or optometrist for review and interpretation.  Results will be sent back to [facility] for review.  Patient has been informed they will be contacted when we receive the results via telephone or MyChart.  Patient confirmed that he does not currently have a primary eye doctor but did mention seeing [facility] about a year ago to get glasses. He does not currently wear Rx glasses because they broke.  Patient denies having any problems with his eyes other than it being difficult to read up close at times without readers.

## 2024-06-10 NOTE — Progress Notes (Signed)
 Established Patient Office Visit   Subjective  Patient ID: William Chapman, male    DOB: 1966/05/19  Age: 58 y.o. MRN: [id number]  Chief Complaint  Patient presents with   Follow-up    Pna, 05/16/24; completed atbx, feeling better, still coughing    He  has a past medical history of Depression, Hyperlipidemia, Hypertension, and Type 2 diabetes mellitus (HCC).  HPI Patient presents to the clinic for hospital discharge and chronic follow up. For the details of today's visit, please refer to assessment and plan.    Flowsheet Row Office Visit from 06/10/2024 in [facility]  PHQ-9 Total Score 16       06/10/2024   10:30 AM 02/19/2024    9:22 AM 10/21/2023    8:38 AM  GAD 7 : Generalized Anxiety Score  Nervous, Anxious, on Edge 2 2 0  Control/stop worrying 1 0 1  Worry too much - different things 2 0 1  Trouble relaxing 2 0 0  Restless 0 0 0  Easily annoyed or irritable 1 1 0  Afraid - awful might happen 0 0 0  Total GAD 7 Score 8 3 2   Anxiety Difficulty Somewhat difficult Somewhat difficult Somewhat difficult     Review of Systems  Constitutional:  Negative for fever.  HENT:  Positive for congestion and sinus pain.   Respiratory:  Positive for cough. Negative for sputum production and shortness of breath.   Cardiovascular:  Negative for chest pain.  Genitourinary:  Negative for dysuria.  Neurological:  Negative for dizziness and headaches.      Objective:     BP (!) 145/90   Pulse 72   Ht 5' 10 (1.778 m)   Wt 202 lb (91.6 kg)   SpO2 98%   BMI 28.98 kg/m  BP Readings from Last 3 Encounters:  06/10/24 (!) 145/90  02/19/24 135/82  11/20/23 (!) 148/88      Physical Exam Vitals reviewed.  Constitutional:      General: He is not in acute distress.    Appearance: Normal appearance. He is not ill-appearing, toxic-appearing or diaphoretic.  HENT:     Head: Normocephalic.  Eyes:     General:        Right eye: No discharge.        Left  eye: No discharge.     Conjunctiva/sclera: Conjunctivae normal.  Cardiovascular:     Rate and Rhythm: Normal rate.     Pulses: Normal pulses.     Heart sounds: Normal heart sounds.  Pulmonary:     Effort: Pulmonary effort is normal. No respiratory distress.     Breath sounds: Normal breath sounds. No wheezing, rhonchi or rales.  Skin:    General: Skin is warm and dry.     Capillary Refill: Capillary refill takes less than 2 seconds.  Neurological:     Mental Status: He is alert.  Psychiatric:        Mood and Affect: Mood normal.        Behavior: Behavior normal.      No results found for any visits on 06/10/24.  The 10-year ASCVD risk score (Arnett DK, et al., 2019) is: 24.6%    Assessment & Plan:  Hospital discharge follow-up Assessment & Plan: BMP and CBC were ordered. The hospital chart, including the discharge summary, was thoroughly reviewed. Medications were carefully reviewed and reconciled with the patient. A chest X-ray was ordered for follow-up of hospital-treated pneumonia (PNA).  Primary hypertension Assessment & Plan: Vitals:   06/10/24 1028 06/10/24 1115  BP: (!) 152/103 (!) 145/90  Not controlled in today's visit Follow up in 2 weeks to recheck blood pressure reading Continue Losartan  100 mg once daily, amlodipine 10 mg once daily  Labs ordered Continued discussion on DASH diet, low sodium diet and maintain a exercise routine for 150 minutes per week.   Orders: -     Lipid panel -     Basic metabolic panel with GFR -     CBC with Differential/Platelet  Mixed hyperlipidemia  TSH (thyroid -stimulating hormone deficiency) -     TSH + free T4  Type 2 diabetes mellitus with other specified complication, without long-term current use of insulin (HCC) Assessment & Plan: Last Hemoglobin A1c: 6.3 Labs: Ordered today, results pending; will follow up accordingly. The patient reports adhering to prescribed medications: Glipizide  5 mg twice daily, D/C  metformin  due to side effects     Reviewed non-pharmacological interventions, including a balanced diet rich in lean proteins, healthy fats, whole grains, and high-fiber vegetables. Emphasized reducing refined sugars and processed carbohydrates, and incorporating more fruits, leafy greens, and legumes. Education: Patient was educated on recognizing signs and symptoms of both hypoglycemia and hyperglycemia, and advised to seek emergency care if these symptoms occur. Follow-Up: Scheduled for follow-up in 4 months, or sooner if needed. Patient Understanding: The patient verbalized understanding of the care plan, and all questions were answered. Additional Care: Ophthalmology exam current. Foot exam results were within normal limits.   Orders: -     Hemoglobin A1c  Chronic cough -     DG Chest 2 View; Future    Return in about 4 months (around 10/11/2024), or if symptoms worsen or fail to improve, for  type 2 diabetes,  cancel office appointment for 8/6 and change it to nurse visit b/p check.   Hilario Kidd Wilhelmena Falter, FNP

## 2024-06-14 ENCOUNTER — Encounter: Payer: Self-pay | Admitting: Physician Assistant

## 2024-06-20 ENCOUNTER — Encounter: Payer: Self-pay | Admitting: Orthopedic Surgery

## 2024-06-20 NOTE — Progress Notes (Signed)
 Office Visit Note   Patient: William Chapman           Date of Birth: [birth year]           MRN: [id number] Visit Date: 06/10/2024              Requested by: Terry Wilhelmena Lloyd Hilario, FNP [phone] [address] [place],  [place] [address] PCP: Terry Wilhelmena Lloyd Hilario, FNP  Chief Complaint  Patient presents with   Left Foot - Follow-up      HPI: Patient is a 58 year old gentleman who presents in follow-up for contusion dorsum of the left midfoot.  Patient has been weightbearing in a postoperative shoe and using the cane he states he feels about the same patient states he has numbness and tingling over the dorsum of his foot he has had pain for 3 years since his injury.  He states his foot feels like it is asleep.  Assessment & Plan: Visit Diagnoses:  1. Pain in left foot   2. Contusion of left foot, sequela     Plan: Will request an MRI scan of the left foot to further evaluate bone and soft tissue.  Follow-up after the MRI is obtained.  Follow-Up Instructions: No follow-ups on file.   Ortho Exam  Patient is alert, oriented, no adenopathy, well-dressed, normal affect, normal respiratory effort. Examination patient has palpable pulses the temperature of his foot is equal bilaterally there are no color changes there is no sweating there is no hypersensitivity to light touch.  There is no bruising or blistering.  He has good range of motion of the toes and ankles.  Patient states he still has persistent pain across the midfoot.    Imaging: No results found. No images are attached to the encounter.  Labs: Lab Results  Component Value Date   HGBA1C 6.3 (H) 02/19/2024   HGBA1C 7.6 (H) 09/25/2023   LABURIC 7.4 01/22/2024   LABURIC 8.3 (H) 09/25/2023     Lab Results  Component Value Date   ALBUMIN 4.4 10/21/2023   ALBUMIN 3.4 (L) 08/20/2018   ALBUMIN 3.9 08/19/2018    No results found for: MG Lab Results  Component Value Date   VD25OH 22.7 (L) 02/19/2024   VD25OH  13.2 (L) 10/21/2023    No results found for: PREALBUMIN    Latest Ref Rng & Units 02/19/2024   10:22 AM 10/21/2023    9:46 AM 08/20/2018    5:30 AM  CBC EXTENDED  WBC 3.4 - 10.8 x10E3/uL 8.4  5.5  5.3   RBC 4.14 - 5.80 x10E6/uL 4.32  4.47  3.77   Hemoglobin 13.0 - 17.7 g/dL 85.6  84.6  87.5   HCT 37.5 - 51.0 % 41.3  43.1  36.9   Platelets 150 - 450 x10E3/uL 301  288  222   NEUT# 1.4 - 7.0 x10E3/uL 4.9  2.6    Lymph# 0.7 - 3.1 x10E3/uL 2.4  2.2       There is no height or weight on file to calculate BMI.  Orders:  Orders Placed This Encounter  Procedures   XR Foot Complete Left   MR Foot Left w/o contrast   No orders of the defined types were placed in this encounter.    Procedures: No procedures performed  Clinical Data: No additional findings.  ROS:  All other systems negative, except as noted in the HPI. Review of Systems  Objective: Vital Signs: There were no vitals taken for this  visit.  Specialty Comments:  No specialty comments available.  PMFS History: Patient Active Problem List   Diagnosis Date Noted   Hospital discharge follow-up 06/10/2024   Anxiety 02/19/2024   Hypertension 10/21/2023   Type 2 diabetes mellitus (HCC) 10/21/2023   Contusion of left foot 07/26/2020   Syncope and collapse 08/19/2018   Past Medical History:  Diagnosis Date   Depression    Hyperlipidemia    Hypertension    Type 2 diabetes mellitus (HCC)     Family History  Problem Relation Age of Onset   Healthy Mother    COPD Father     History reviewed. No pertinent surgical history. Social History   Occupational History   Occupation: disabled.  Tobacco Use   Smoking status: Every Day    Current packs/day: 0.25    Types: Cigarettes   Smokeless tobacco: Never  Vaping Use   Vaping status: Never Used  Substance and Sexual Activity   Alcohol use: Yes    Comment: 4-5 drinks only on the weekend   Drug use: Not Currently   Sexual activity: Not Currently    Birth  control/protection: None

## 2024-06-22 ENCOUNTER — Ambulatory Visit (INDEPENDENT_AMBULATORY_CARE_PROVIDER_SITE_OTHER)

## 2024-06-22 DIAGNOSIS — I10 Essential (primary) hypertension: Secondary | ICD-10-CM | POA: Diagnosis not present

## 2024-06-22 NOTE — Progress Notes (Signed)
 Patient is in office today for a nurse visit for Blood Pressure Check. Patients blood pressure was,134/84

## 2024-06-23 ENCOUNTER — Ambulatory Visit: Payer: Self-pay | Admitting: Family Medicine

## 2024-06-23 ENCOUNTER — Ambulatory Visit

## 2024-06-29 DIAGNOSIS — J019 Acute sinusitis, unspecified: Secondary | ICD-10-CM | POA: Diagnosis not present

## 2024-06-30 ENCOUNTER — Other Ambulatory Visit

## 2024-06-30 ENCOUNTER — Inpatient Hospital Stay: Admission: RE | Admit: 2024-06-30 | Source: Ambulatory Visit

## 2024-07-01 ENCOUNTER — Telehealth: Payer: Self-pay

## 2024-07-01 NOTE — Telephone Encounter (Signed)
 Thank you :)

## 2024-07-01 NOTE — Telephone Encounter (Signed)
 Patient called stating that he had a sinus problem and had to go to Urgent Care and they put him on some medication so he canceled his MRI appointment for yesterday. He is asking if he could be referred to [facility] for the MRI, that it would be so much closer for him. He is asking for a return call at [phone].  Please call and advise

## 2024-07-05 DIAGNOSIS — E1169 Type 2 diabetes mellitus with other specified complication: Secondary | ICD-10-CM | POA: Diagnosis not present

## 2024-07-05 DIAGNOSIS — F411 Generalized anxiety disorder: Secondary | ICD-10-CM | POA: Diagnosis not present

## 2024-07-05 DIAGNOSIS — Z6829 Body mass index (BMI) 29.0-29.9, adult: Secondary | ICD-10-CM | POA: Diagnosis not present

## 2024-07-05 DIAGNOSIS — Z008 Encounter for other general examination: Secondary | ICD-10-CM | POA: Diagnosis not present

## 2024-07-05 DIAGNOSIS — I10 Essential (primary) hypertension: Secondary | ICD-10-CM | POA: Diagnosis not present

## 2024-07-05 DIAGNOSIS — E663 Overweight: Secondary | ICD-10-CM | POA: Diagnosis not present

## 2024-07-05 DIAGNOSIS — E785 Hyperlipidemia, unspecified: Secondary | ICD-10-CM | POA: Diagnosis not present

## 2024-07-08 ENCOUNTER — Other Ambulatory Visit: Payer: Self-pay | Admitting: Family Medicine

## 2024-07-13 ENCOUNTER — Ambulatory Visit (INDEPENDENT_AMBULATORY_CARE_PROVIDER_SITE_OTHER): Payer: Self-pay

## 2024-07-13 VITALS — Ht 69.0 in | Wt 202.0 lb

## 2024-07-13 DIAGNOSIS — Z0001 Encounter for general adult medical examination with abnormal findings: Secondary | ICD-10-CM | POA: Diagnosis not present

## 2024-07-13 DIAGNOSIS — Z1211 Encounter for screening for malignant neoplasm of colon: Secondary | ICD-10-CM

## 2024-07-13 DIAGNOSIS — F17200 Nicotine dependence, unspecified, uncomplicated: Secondary | ICD-10-CM

## 2024-07-13 DIAGNOSIS — Z0000 Encounter for general adult medical examination without abnormal findings: Secondary | ICD-10-CM

## 2024-07-13 NOTE — Progress Notes (Signed)
 Please attest and cosign this visit due to patients primary care provider not being in the office at the time the visit was completed.  Subjective:   William Chapman is a 58 y.o. who presents for a Medicare Wellness preventive visit.  As a reminder, Annual Wellness Visits don't include a physical exam, and some assessments may be limited, especially if this visit is performed virtually. We may recommend an in-person follow-up visit with your provider if needed.  Visit Complete: Virtual I connected with  William Chapman [place] on 07/13/24 by a video and audio enabled telemedicine application and verified that I am speaking with the correct person using two identifiers.  Patient Location: Home  Provider Location: Home Office  I discussed the limitations of evaluation and management by telemedicine. The patient expressed understanding and agreed to proceed.  Vital Signs: Because this visit was a virtual/telehealth visit, some criteria may be missing or patient reported. Any vitals not documented were not able to be obtained and vitals that have been documented are patient reported.  Persons Participating in Visit: Patient.  AWV Questionnaire: Yes: Patient Medicare AWV questionnaire was completed by the patient on 07/12/2024; I have confirmed that all information answered by patient is correct and no changes since this date.  Cardiac Risk Factors include: advanced age (>71men, >56 women);diabetes mellitus;dyslipidemia;hypertension;male gender;sedentary lifestyle     Objective:    Today's Vitals   07/13/24 1116  Weight: 202 lb (91.6 kg)  Height: 5' 9 (1.753 m)   Body mass index is 29.83 kg/m.     07/13/2024   11:28 AM 09/15/2023    9:30 PM 08/20/2018    1:40 AM 08/19/2018    7:36 PM  Advanced Directives  Does Patient Have a Medical Advance Directive? No No No  No   Would patient like information on creating a medical advance directive? No - Patient declined  No - Patient declined        Data saved with a previous flowsheet row definition    Current Medications (verified) Outpatient Encounter Medications as of 07/13/2024  Medication Sig   albuterol  (VENTOLIN  HFA) 108 (90 Base) MCG/ACT inhaler Inhale 1-2 puffs into the lungs every 6 (six) hours as needed for wheezing or shortness of breath.   amLODipine (NORVASC) 10 MG tablet Take 10 mg by mouth daily.   atorvastatin  (LIPITOR) 40 MG tablet Take 1 tablet (40 mg total) by mouth daily.   busPIRone  (BUSPAR ) 10 MG tablet Take 1 tablet by mouth twice daily   colchicine  0.6 MG tablet Take 0.6 mg by mouth daily.   glipiZIDE  (GLUCOTROL ) 5 MG tablet Take 1 tablet (5 mg total) by mouth 2 (two) times daily before a meal.   ibuprofen  (ADVIL ) 800 MG tablet TAKE 1 TABLET BY MOUTH EVERY 8 HOURS AS NEEDED   losartan  (COZAAR ) 100 MG tablet Take 100 mg by mouth daily.   methocarbamol (ROBAXIN) 500 MG tablet Take 500 mg by mouth 2 (two) times daily.   naproxen (NAPROSYN) 500 MG tablet Take 500 mg by mouth 2 (two) times daily.   pregabalin  (LYRICA ) 50 MG capsule Take 1 capsule by mouth twice daily   propranolol  (INDERAL ) 20 MG tablet Take 1 tablet (20 mg total) by mouth 2 (two) times daily as needed (Anxiety).   venlafaxine  XR (EFFEXOR -XR) 150 MG 24 hr capsule Take 150 mg by mouth daily with breakfast.    No facility-administered encounter medications on file as of 07/13/2024.    Allergies (verified) Iodinated contrast media  History: Past Medical History:  Diagnosis Date   Allergy 09/20/23   Not sure exact date   Anxiety 03/08/99   Over twenty years ago. Not sure about date   Depression    Hyperlipidemia    Hypertension    Neuromuscular disorder (HCC) 01/29/20   Not sure about date   Type 2 diabetes mellitus (HCC)    History reviewed. No pertinent surgical history. Family History  Problem Relation Age of Onset   Healthy Mother    COPD Father    Diabetes Sister    Social History   Socioeconomic History   Marital status:  Married    Spouse name: Deloris Mittag   Number of children: Not on file   Years of education: Not on file   Highest education level: GED or equivalent  Occupational History   Occupation: disabled.  Tobacco Use   Smoking status: Every Day    Current packs/day: 0.50    Average packs/day: 0.5 packs/day for 13.6 years (6.8 ttl pk-yrs)    Types: Cigarettes    Start date: 2012   Smokeless tobacco: Never  Vaping Use   Vaping status: Never Used  Substance and Sexual Activity   Alcohol use: Yes    Comment: 4-5 drinks only on the weekend   Drug use: Not Currently   Sexual activity: Not Currently    Birth control/protection: None  Other Topics Concern   Not on file  Social History Narrative   Not on file   Social Drivers of Health   Financial Resource Strain: High Risk (07/13/2024)   Overall Financial Resource Strain (CARDIA)    Difficulty of Paying Living Expenses: Hard  Food Insecurity: No Food Insecurity (07/13/2024)   Hunger Vital Sign    Worried About Running Out of Food in the Last Year: Never true    Ran Out of Food in the Last Year: Never true  Transportation Needs: No Transportation Needs (07/13/2024)   PRAPARE - Administrator, Civil Service (Medical): No    Lack of Transportation (Non-Medical): No  Physical Activity: Inactive (07/13/2024)   Exercise Vital Sign    Days of Exercise per Week: 0 days    Minutes of Exercise per Session: 0 min  Stress: No Stress Concern Present (07/13/2024)   [employer] of Occupational Health - Occupational Stress Questionnaire    Feeling of Stress: Not at all  Social Connections: Moderately Isolated (07/13/2024)   Social Connection and Isolation Panel    Frequency of Communication with Friends and Family: Twice a week    Frequency of Social Gatherings with Friends and Family: Once a week    Attends Religious Services: Never    Database administrator or Organizations: No    Attends Engineer, structural: Not on file     Marital Status: Married    Tobacco Counseling Ready to quit: Yes Counseling given: Yes    Clinical Intake:  Pre-visit preparation completed: Yes  Pain : No/denies pain     BMI - recorded: 29.83 Nutritional Status: BMI 25 -29 Overweight Diabetes: Yes CBG done?: No (virtual visit.) Did pt. bring in CBG monitor from home?: No  Lab Results  Component Value Date   HGBA1C 6.3 (H) 02/19/2024   HGBA1C 7.6 (H) 09/25/2023     How often do you need to have someone help you when you read instructions, pamphlets, or other written materials from your doctor or pharmacy?: 1 - Never  Interpreter Needed?: No  Information entered by ::  Shannan Garfinkel W CMA (AAMA)   Activities of Daily Living     07/13/2024   11:28 AM  In your present state of health, do you have any difficulty performing the following activities:  Hearing? 0  Vision? 0  Difficulty concentrating or making decisions? 0  Walking or climbing stairs? 0  Dressing or bathing? 0  Doing errands, shopping? 0  Preparing Food and eating ? N  Using the Toilet? N  In the past six months, have you accidently leaked urine? N  Do you have problems with loss of bowel control? N  Managing your Medications? N  Managing your Finances? N  Housekeeping or managing your Housekeeping? N    Patient Care Team: Del Wilhelmena Falter, Hilario, FNP as PCP - General (Family Medicine)  I have updated your Care Teams any recent Medical Services you may have received from other providers in the past year.     Assessment:   This is a routine wellness examination for [place].  Hearing/Vision screen Hearing Screening - Comments:: Patient denies any hearing difficulties.   Vision Screening - Comments:: Wears rx glasses - up to date with routine eye exams with Dr. Thresa Louder at [facility] location     Goals Addressed               This Visit's Progress     Increase Activity (pt-stated)        I would like to lose a little  weight and be able to do more things        Depression Screen     07/13/2024   12:11 PM 06/10/2024   10:30 AM 02/19/2024    9:22 AM 10/21/2023    8:38 AM  PHQ 2/9 Scores  PHQ - 2 Score 0 3 0 2  PHQ- 9 Score 0 16 4 6     Fall Risk     07/13/2024   11:57 AM 02/19/2024    9:28 AM 10/21/2023    8:39 AM 10/07/2023   10:48 AM  Fall Risk   Falls in the past year? 0 0 0 0  Number falls in past yr: 0 0  0  Injury with Fall? 0 0 0   Risk for fall due to : Impaired balance/gait;Impaired mobility Impaired balance/gait Impaired mobility No Fall Risks  Follow up Falls prevention discussed;Education provided;Falls evaluation completed Falls evaluation completed Follow up appointment Falls evaluation completed    MEDICARE RISK AT HOME:  Medicare Risk at Home Any stairs in or around the home?: No If so, are there any without handrails?: No Home free of loose throw rugs in walkways, pet beds, electrical cords, etc?: Yes Adequate lighting in your home to reduce risk of falls?: Yes Life alert?: No Use of a cane, walker or w/c?: Yes Grab bars in the bathroom?: No Shower chair or bench in shower?: No Elevated toilet seat or a handicapped toilet?: Yes  TIMED UP AND GO:  Was the test performed?  No  Cognitive Function: 6CIT completed        07/13/2024   12:11 PM  6CIT Screen  What Year? 0 points  What month? 0 points  What time? 0 points  Count back from 20 0 points  Months in reverse 0 points  Repeat phrase 0 points  Total Score 0 points    Immunizations  There is no immunization history on file for this patient.  Screening Tests Health Maintenance  Topic Date Due   Pneumococcal Vaccine: 50+  Years (1 of 2 - PCV) Never done   Hepatitis B Vaccines 19-59 Average Risk (1 of 3 - 19+ 3-dose series) Never done   Fecal DNA (Cologuard)  Never done   Zoster Vaccines- Shingrix (1 of 2) Never done   COVID-19 Vaccine (1 - 2024-25 season) Never done   INFLUENZA VACCINE  06/18/2024    DTaP/Tdap/Td (1 - Tdap) 10/20/2024 (Originally 10/19/1985)   HEMOGLOBIN A1C  08/20/2024   Diabetic kidney evaluation - Urine ACR  10/20/2024   FOOT EXAM  10/20/2024   Diabetic kidney evaluation - eGFR measurement  02/18/2025   OPHTHALMOLOGY EXAM  06/10/2025   Medicare Annual Wellness (AWV)  07/13/2025   Hepatitis C Screening  Completed   HIV Screening  Completed   HPV VACCINES  Aged Out   Meningococcal B Vaccine  Aged Out    Health Maintenance  Health Maintenance Due  Topic Date Due   Pneumococcal Vaccine: 50+ Years (1 of 2 - PCV) Never done   Hepatitis B Vaccines 19-59 Average Risk (1 of 3 - 19+ 3-dose series) Never done   Fecal DNA (Cologuard)  Never done   Zoster Vaccines- Shingrix (1 of 2) Never done   COVID-19 Vaccine (1 - 2024-25 season) Never done   INFLUENZA VACCINE  06/18/2024   Health Maintenance Items Addressed: Cologuard Ordered Referral placed for smoking cessation  Additional Screening:  Vision Screening: Recommended annual ophthalmology exams for early detection of glaucoma and other disorders of the eye. Would you like a referral to an eye doctor? Resources provided   Dental Screening: Recommended annual dental exams for proper oral hygiene  Community Resource Referral / Chronic Care Management: CRR required this visit?  No   CCM required this visit?  No   Plan:    I have personally reviewed and noted the following in the patient's chart:   Medical and social history Use of alcohol, tobacco or illicit drugs  Current medications and supplements including opioid prescriptions. Patient is not currently taking opioid prescriptions. Functional ability and status Nutritional status Physical activity Advanced directives List of other physicians Hospitalizations, surgeries, and ER visits in previous 12 months Vitals Screenings to include cognitive, depression, and falls Referrals and appointments  In addition, I have reviewed and discussed with patient  certain preventive protocols, quality metrics, and best practice recommendations. A written personalized care plan for preventive services as well as general preventive health recommendations were provided to patient.   Kjerstin Abrigo, CMA   07/13/2024   After Visit Summary: (MyChart) Due to this being a telephonic visit, the after visit summary with patients personalized plan was offered to patient via MyChart   Notes: Nothing significant to report at this time.

## 2024-07-13 NOTE — Patient Instructions (Signed)
 William Chapman , Thank you for taking time out of your busy schedule to complete your Annual Wellness Visit with me. I enjoyed our conversation and look forward to speaking with you again next year. I, as well as your care team,  appreciate your ongoing commitment to your health goals. Please review the following plan we discussed and let me know if I can assist you in the future.  Your Game plan/ To Do List   Follow up Visits: We will see or speak with you next year for your Next Medicare AWV with our clinical staff   Clinician Recommendations:  Aim for 30 minutes of exercise or brisk walking, 6-8 glasses of water, and 5 servings of fruits and vegetables each day.    Wishing you many blessings and good health during the next year until our next visit.  -Krissie Merrick   This is a list of the screenings recommended for you:  Health Maintenance  Topic Date Due   Pneumococcal Vaccine for age over 61 (1 of 2 - PCV) Never done   Hepatitis B Vaccine (1 of 3 - 19+ 3-dose series) Never done   Cologuard (Stool DNA test)  Never done   Zoster (Shingles) Vaccine (1 of 2) Never done   COVID-19 Vaccine (1 - 2024-25 season) Never done   Flu Shot  06/18/2024   DTaP/Tdap/Td vaccine (1 - Tdap) 10/20/2024*   Hemoglobin A1C  08/20/2024   Yearly kidney health urinalysis for diabetes  10/20/2024   Complete foot exam   10/20/2024   Yearly kidney function blood test for diabetes  02/18/2025   Eye exam for diabetics  06/10/2025   Medicare Annual Wellness Visit  07/13/2025   Hepatitis C Screening  Completed   HIV Screening  Completed   HPV Vaccine  Aged Out   Meningitis B Vaccine  Aged Out  *Topic was postponed. The date shown is not the original due date.    Advanced directives: (Declined) Advance directive discussed with you today. Even though you declined this today, please call our office should you change your mind, and we can give you the proper paperwork for you to fill out. Advance Care Planning is important  because it:  [x]  Makes sure you receive the medical care that is consistent with your values, goals, and preferences  [x]  It provides guidance to your family and loved ones and reduces their decisional burden about whether or not they are making the right decisions based on your wishes.  Follow the link provided in your after visit summary or read over the paperwork we have mailed to you to help you started getting your Advance Directives in place. If you need assistance in completing these, please reach out to us  so that we can help you!  See attachments for Preventive Care and Fall Prevention Tips.

## 2024-07-16 DIAGNOSIS — Z1211 Encounter for screening for malignant neoplasm of colon: Secondary | ICD-10-CM | POA: Diagnosis not present

## 2024-07-22 LAB — COLOGUARD: COLOGUARD: NEGATIVE

## 2024-07-28 ENCOUNTER — Ambulatory Visit: Payer: Self-pay

## 2024-08-05 ENCOUNTER — Other Ambulatory Visit: Payer: Self-pay | Admitting: Family Medicine

## 2024-08-14 ENCOUNTER — Other Ambulatory Visit: Payer: Self-pay | Admitting: Family Medicine

## 2024-09-20 ENCOUNTER — Encounter: Payer: Self-pay | Admitting: Radiology

## 2024-10-04 ENCOUNTER — Ambulatory Visit

## 2024-10-04 VITALS — BP 137/85 | HR 85 | Ht 69.0 in | Wt 208.1 lb

## 2024-10-04 DIAGNOSIS — I10 Essential (primary) hypertension: Secondary | ICD-10-CM

## 2024-10-04 DIAGNOSIS — F17200 Nicotine dependence, unspecified, uncomplicated: Secondary | ICD-10-CM | POA: Diagnosis not present

## 2024-10-04 DIAGNOSIS — Z7984 Long term (current) use of oral hypoglycemic drugs: Secondary | ICD-10-CM

## 2024-10-04 DIAGNOSIS — E119 Type 2 diabetes mellitus without complications: Secondary | ICD-10-CM | POA: Diagnosis not present

## 2024-10-04 MED ORDER — ALBUTEROL SULFATE HFA 108 (90 BASE) MCG/ACT IN AERS: 1.0000 | INHALATION_SPRAY | Freq: Four times a day (QID) | RESPIRATORY_TRACT | 1 refills | Status: AC | PRN

## 2024-10-04 NOTE — Progress Notes (Unsigned)
 Established Patient Office Visit  Subjective   Patient ID: William Chapman, male    DOB: September 21, 1966  Age: 58 y.o. MRN: [id number]  Chief Complaint  Patient presents with   Medical Management of Chronic Issues    4 month follow up, pt had pneumonia 2 weeks ago on his left side and no he has a huge bruise on his left the don't know if he broke a rib or pulled something     HPI  Patient Active Problem List   Diagnosis Date Noted   Tobacco use disorder 10/05/2024   Hospital discharge follow-up 06/10/2024   Anxiety 02/19/2024   Hypertension 10/21/2023   Type 2 diabetes mellitus (HCC) 10/21/2023   Contusion of left foot 07/26/2020   Syncope and collapse 08/19/2018    ROS    Objective:     BP 137/85   Pulse 85   Ht 5' 9 (1.753 m)   Wt 208 lb 1.9 oz (94.4 kg)   SpO2 97%   BMI 30.73 kg/m  BP Readings from Last 3 Encounters:  10/04/24 137/85  06/10/24 (!) 145/90  02/19/24 135/82   Wt Readings from Last 3 Encounters:  10/04/24 208 lb 1.9 oz (94.4 kg)  07/13/24 202 lb (91.6 kg)  06/10/24 202 lb (91.6 kg)     Physical Exam Vitals and nursing note reviewed.  Constitutional:      Appearance: Normal appearance.  HENT:     Head: Normocephalic.     Right Ear: Tympanic membrane, ear canal and external ear normal.     Left Ear: Tympanic membrane, ear canal and external ear normal.     Nose: Nose normal.     Mouth/Throat:     Mouth: Mucous membranes are moist.     Pharynx: Oropharynx is clear.  Eyes:     Extraocular Movements: Extraocular movements intact.     Pupils: Pupils are equal, round, and reactive to light.  Cardiovascular:     Rate and Rhythm: Normal rate and regular rhythm.  Pulmonary:     Effort: Pulmonary effort is normal.     Breath sounds: Normal breath sounds.  Chest:     Chest wall: Tenderness (left lateral bruising and tenderness) present.  Musculoskeletal:     Cervical back: Normal range of motion and neck supple.  Skin:    General: Skin is  warm and dry.  Neurological:     Mental Status: He is alert and oriented to person, place, and time.  Psychiatric:        Mood and Affect: Mood normal.        Thought Content: Thought content normal.        Last CBC Lab Results  Component Value Date   WBC 12.0 (H) 10/04/2024   HGB 13.3 10/04/2024   HCT 38.8 10/04/2024   MCV 97 10/04/2024   MCH 33.3 (H) 10/04/2024   RDW 13.8 10/04/2024   PLT 372 10/04/2024   Last metabolic panel Lab Results  Component Value Date   GLUCOSE 64 (L) 10/04/2024   NA 138 10/04/2024   K 4.3 10/04/2024   CL 98 10/04/2024   CO2 25 10/04/2024   BUN 16 10/04/2024   CREATININE 0.72 (L) 10/04/2024   EGFR 107 10/04/2024   CALCIUM  10.0 10/04/2024   PROT 7.3 10/04/2024   ALBUMIN 4.4 10/04/2024   LABGLOB 2.9 10/04/2024   BILITOT 0.5 10/04/2024   ALKPHOS 105 10/04/2024   AST 15 10/04/2024   ALT 20 10/04/2024   ANIONGAP  11 09/15/2023   Last lipids Lab Results  Component Value Date   CHOL 214 (H) 10/04/2024   HDL 59 10/04/2024   LDLCALC 74 10/04/2024   TRIG 517 (H) 10/04/2024   CHOLHDL 3.6 10/04/2024   Last hemoglobin A1c Lab Results  Component Value Date   HGBA1C 5.9 (H) 10/04/2024   Last thyroid  functions Lab Results  Component Value Date   TSH 1.060 10/21/2023   FREET4 0.90 10/21/2023   Last vitamin D  Lab Results  Component Value Date   VD25OH 22.7 (L) 02/19/2024   Last vitamin B12 and Folate Lab Results  Component Value Date   VITAMINB12 285 10/21/2023      The 10-year ASCVD risk score (Arnett DK, et al., 2019) is: 25.7%    Assessment & Plan:   Problem List Items Addressed This Visit       Cardiovascular and Mediastinum   Hypertension   Continue Losartan  100 mg once daily, amlodipine 10 mg once daily  Labs ordered Continued discussion on DASH diet, low sodium diet and maintain a exercise routine for 150 minutes per week.       Relevant Orders   Lipid Profile (Completed)   CBC with Differential/Platelet  (Completed)     Endocrine   Type 2 diabetes mellitus (HCC) - Primary   Last Hemoglobin A1c: 6.3 Labs: Ordered today, results pending; will follow up accordingly. The patient reports adhering to prescribed medications: Glipizide  5 mg twice daily, D/C metformin  due to side effects           Relevant Orders   HgB A1c (Completed)   Lipid Profile (Completed)   CMP14+EGFR (Completed)     Other   Tobacco use disorder (Chronic)   Smoking cessation instruction/counseling given:  counseled patient on the dangers of tobacco use, advised patient to stop smoking, and reviewed strategies to maximize success.   Four minutes spent on tobacco cessation.         Return in about 3 months (around 01/04/2025) for chronic follow-up with PCP.    Leita Longs, FNP

## 2024-10-05 DIAGNOSIS — F17200 Nicotine dependence, unspecified, uncomplicated: Secondary | ICD-10-CM | POA: Insufficient documentation

## 2024-10-05 LAB — LIPID PANEL
Chol/HDL Ratio: 3.6 ratio (ref 0.0–5.0)
Cholesterol, Total: 214 mg/dL — ABNORMAL HIGH (ref 100–199)
HDL: 59 mg/dL (ref >39)
LDL Chol Calc (NIH): 74 mg/dL (ref 0–99)
Triglycerides: 517 mg/dL — ABNORMAL HIGH (ref 0–149)
VLDL Cholesterol Cal: 81 mg/dL — ABNORMAL HIGH (ref 5–40)

## 2024-10-05 LAB — HEMOGLOBIN A1C
Est. average glucose Bld gHb Est-mCnc: 123 mg/dL
Hgb A1c MFr Bld: 5.9 % — ABNORMAL HIGH (ref 4.8–5.6)

## 2024-10-05 LAB — CMP14+EGFR
ALT: 20 IU/L (ref 0–44)
AST: 15 IU/L (ref 0–40)
Albumin: 4.4 g/dL (ref 3.8–4.9)
Alkaline Phosphatase: 105 IU/L (ref 47–123)
BUN/Creatinine Ratio: 22 — ABNORMAL HIGH (ref 9–20)
BUN: 16 mg/dL (ref 6–24)
Bilirubin Total: 0.5 mg/dL (ref 0.0–1.2)
CO2: 25 mmol/L (ref 20–29)
Calcium: 10 mg/dL (ref 8.7–10.2)
Chloride: 98 mmol/L (ref 96–106)
Creatinine, Ser: 0.72 mg/dL — ABNORMAL LOW (ref 0.76–1.27)
Globulin, Total: 2.9 g/dL (ref 1.5–4.5)
Glucose: 64 mg/dL — ABNORMAL LOW (ref 70–99)
Potassium: 4.3 mmol/L (ref 3.5–5.2)
Sodium: 138 mmol/L (ref 134–144)
Total Protein: 7.3 g/dL (ref 6.0–8.5)
eGFR: 107 mL/min/1.73 (ref >59)

## 2024-10-05 LAB — CBC WITH DIFFERENTIAL/PLATELET
Basophils Absolute: 0.1 x10E3/uL (ref 0.0–0.2)
Basos: 1 %
EOS (ABSOLUTE): 0.5 x10E3/uL — ABNORMAL HIGH (ref 0.0–0.4)
Eos: 4 %
Hematocrit: 38.8 % (ref 37.5–51.0)
Hemoglobin: 13.3 g/dL (ref 13.0–17.7)
Immature Grans (Abs): 0 x10E3/uL (ref 0.0–0.1)
Immature Granulocytes: 0 %
Lymphocytes Absolute: 4.8 x10E3/uL — ABNORMAL HIGH (ref 0.7–3.1)
Lymphs: 40 %
MCH: 33.3 pg — ABNORMAL HIGH (ref 26.6–33.0)
MCHC: 34.3 g/dL (ref 31.5–35.7)
MCV: 97 fL (ref 79–97)
Monocytes Absolute: 0.7 x10E3/uL (ref 0.1–0.9)
Monocytes: 6 %
Neutrophils Absolute: 5.9 x10E3/uL (ref 1.4–7.0)
Neutrophils: 49 %
Platelets: 372 x10E3/uL (ref 150–450)
RBC: 3.99 x10E6/uL — ABNORMAL LOW (ref 4.14–5.80)
RDW: 13.8 % (ref 11.6–15.4)
WBC: 12 x10E3/uL — ABNORMAL HIGH (ref 3.4–10.8)

## 2024-10-05 NOTE — Assessment & Plan Note (Signed)
 Continue Losartan  100 mg once daily, amlodipine 10 mg once daily  Labs ordered Continued discussion on DASH diet, low sodium diet and maintain a exercise routine for 150 minutes per week.

## 2024-10-05 NOTE — Assessment & Plan Note (Signed)
 Smoking cessation instruction/counseling given:  counseled patient on the dangers of tobacco use, advised patient to stop smoking, and reviewed strategies to maximize success.   Four minutes spent on tobacco cessation.

## 2024-10-05 NOTE — Assessment & Plan Note (Signed)
 Last Hemoglobin A1c: 6.3 Labs: Ordered today, results pending; will follow up accordingly. The patient reports adhering to prescribed medications: Glipizide  5 mg twice daily, D/C metformin  due to side effects

## 2024-10-28 ENCOUNTER — Other Ambulatory Visit: Payer: Self-pay | Admitting: Family Medicine

## 2024-12-09 ENCOUNTER — Ambulatory Visit: Payer: Self-pay

## 2024-12-09 NOTE — Telephone Encounter (Signed)
" ° ° °  Trouble breathing and hard to catch his breathe. Discomfort in upper chest  "

## 2024-12-09 NOTE — Telephone Encounter (Signed)
 FYI Only or Action Required?: FYI only for provider: appointment scheduled on 1/23.  Patient was last seen in primary care on 10/04/2024 by Bevely Doffing, FNP.  Called Nurse Triage reporting Shortness of Breath.  Symptoms began several months ago.  Interventions attempted: Prescription medications: Albuterol , steroids.  Symptoms are: gradually worsening.  Triage Disposition: See PCP When Office is Open (Within 3 Days)  Patient/caregiver understands and will follow disposition?: Yes    For 3 months pt with moderate cough with clear white phlegm, nasal congestion, and some SOB. SOB mild at rest, moderate with exertion and when coughing. No COPD or asthma. No CP. No fever. Dx with mild walking PNA at UC at one point. Took steroids. Symptoms improved but now back. Symptoms slightly worse since this started 3 months ago.  Scheduled appt with provider at different office in pt region d/t no availability at home office with any provider until March. Advised UC or ED for worsening symptoms.     Note PAS: Trouble breathing and hard to catch his breathe. Discomfort in upper chest  Reason for Disposition  [1] MODERATE longstanding difficulty breathing (e.g., speaks in phrases, SOB even at rest, pulse 100-120) AND [2] SAME as normal    Mild SOB at rest and moderate with exertion x3 months. No hx of COPD or asthma.  Answer Assessment - Initial Assessment Questions 1. RESPIRATORY STATUS: Describe your breathing? (e.g., wheezing, shortness of breath, unable to speak, severe coughing)      Moderate SOB with exertion, mild at rest  2. ONSET: When did this breathing problem begin?      3 months ago  3. PATTERN Does the difficult breathing come and go, or has it been constant since it started?      Fluctuates  4. SEVERITY: How bad is your breathing? (e.g., mild, moderate, severe)      Moderate SOB with exertion, mild at rest  5. RECURRENT SYMPTOM: Have you had difficulty breathing  before? If Yes, ask: When was the last time? and What happened that time?      For the past 3 months  6. CARDIAC HISTORY: Do you have any history of heart disease? (e.g., heart attack, angina, bypass surgery, angioplasty)      Htn  7. LUNG HISTORY: Do you have any history of lung disease?  (e.g., pulmonary embolus, asthma, emphysema)     Hx of bronchitis  8. CAUSE: What do you think is causing the breathing problem?      Walking PNA  9. OTHER SYMPTOMS: Do you have any other symptoms? (e.g., chest pain, cough, dizziness, fever, runny nose)     Nasal congestion  10. O2 SATURATION MONITOR:  Do you use an oxygen saturation monitor (pulse oximeter) at home? If Yes, ask: What is your reading (oxygen level) today? What is your usual oxygen saturation reading? (e.g., 95%)       Denies monitoring  Protocols used: Breathing Difficulty-A-AH

## 2024-12-10 ENCOUNTER — Ambulatory Visit

## 2024-12-10 VITALS — BP 144/84 | HR 89 | Temp 98.4°F | Ht 69.0 in | Wt 208.8 lb

## 2024-12-10 DIAGNOSIS — F17210 Nicotine dependence, cigarettes, uncomplicated: Secondary | ICD-10-CM | POA: Diagnosis not present

## 2024-12-10 DIAGNOSIS — I10 Essential (primary) hypertension: Secondary | ICD-10-CM | POA: Diagnosis not present

## 2024-12-10 DIAGNOSIS — J4521 Mild intermittent asthma with (acute) exacerbation: Secondary | ICD-10-CM | POA: Diagnosis not present

## 2024-12-10 DIAGNOSIS — F17200 Nicotine dependence, unspecified, uncomplicated: Secondary | ICD-10-CM

## 2024-12-10 MED ORDER — BUDESONIDE-FORMOTEROL FUMARATE 80-4.5 MCG/ACT IN AERO: 2.0000 | INHALATION_SPRAY | Freq: Two times a day (BID) | RESPIRATORY_TRACT | 4 refills | Status: AC

## 2024-12-10 MED ORDER — VARENICLINE TARTRATE (STARTER) 0.5 MG X 11 & 1 MG X 42 PO TBPK: ORAL_TABLET | ORAL | 0 refills | Status: AC

## 2024-12-10 NOTE — Progress Notes (Signed)
 "  Acute Office Visit  Subjective:     Patient ID: William Chapman, male    DOB: Jun 25, 1966, 59 y.o.   MRN: [id number]  Chief Complaint  Patient presents with   Cough    Cough with SOB and congestion x a couple months. Albuterol  helps for a little but not long, used 3-4 times daily. States in December he had walking pneumonia and had a huge bruise on left side of ribs afterwards.    Cough Associated symptoms include shortness of breath and wheezing. Pertinent negatives include no chest pain.   Discussed the use of AI scribe software for clinical note transcription with the patient, who gave verbal consent to proceed.  History of Present Illness William Chapman is a 59 year old male who presents with persistent chest congestion and shortness of breath.  He has been experiencing persistent chest congestion and shortness of breath over the past few months. Typically, he experiences bronchitis once a year during the summer, but this time the symptoms have lingered. He describes congestion in his throat and chest that 'just won't go away.'  Denies any upper respiratory symptoms, fever, body aches, or chills.   In December, he was diagnosed with pneumonia on the left side and was treated with steroids, which resolved the pneumonia but not the congestion. Over the past two to three weeks, he has been using his albuterol  inhaler more frequently, about three to four times a day, due to increased shortness of breath. He also reports wheezing and coughing up 'foamy white' congestion. No sneezing, fever, body aches, chills, or sick contacts. He has not been previously diagnosed with asthma, only bronchitis.  He has a history of high blood pressure and is currently taking amlodipine and losartan . His blood pressure has always been high, and he attributes a recent high reading to smoking two cigarettes before the appointment. He smokes about half a pack of cigarettes a day and is trying to quit, although  he finds it challenging as his wife and her son also smoke.   Review of Systems  Constitutional:  Negative for fatigue.  Respiratory:  Positive for cough, shortness of breath and wheezing. Negative for chest tightness.   Cardiovascular:  Negative for chest pain, palpitations and leg swelling.  Neurological:  Negative for dizziness and light-headedness.    Objective:    Today's Vitals   12/10/24 1004 12/10/24 1015  BP: (!) 158/82 (!) 144/84  Pulse: 89   Temp: 98.4 F (36.9 C)   SpO2: 98%   Weight: 208 lb 12.8 oz (94.7 kg)   Height: 5' 9 (1.753 m)    Body mass index is 30.83 kg/m.   Physical Exam Vitals and nursing note reviewed.  Constitutional:      General: He is not in acute distress.    Appearance: Normal appearance. He is not ill-appearing.  Cardiovascular:     Rate and Rhythm: Normal rate and regular rhythm.     Heart sounds: Normal heart sounds, S1 normal and S2 normal. No murmur heard. Pulmonary:     Effort: Pulmonary effort is normal. No respiratory distress.     Breath sounds: Normal breath sounds. No wheezing.  Musculoskeletal:     Right lower leg: No edema.     Left lower leg: No edema.  Neurological:     Mental Status: He is alert.  Psychiatric:        Mood and Affect: Mood normal.        Behavior: Behavior  normal.        Thought Content: Thought content normal.        Judgment: Judgment normal.    No results found for any visits on 12/10/24.    Assessment & Plan:  1. Mild intermittent asthma with exacerbation (Primary) - Patient looks well on exam, with no wheezing or SOB. Suspected asthma with increased albuterol  use and symptoms indicative of exacerbation. No prior diagnosis but history of recurrent bronchitis and recent pneumonia. - Prescribed Symbicort once daily for maintenance therapy. - Provided refills for six months. - budesonide-formoterol (SYMBICORT) 80-4.5 MCG/ACT inhaler; Inhale 2 puffs into the lungs 2 (two) times daily.  Dispense: 1  each; Refill: 4  2. Tobacco use disorder - Chronic nicotine dependence with current use of half a pack per day. Interested in quitting despite family influence. - Prescribed Chantix starter pack. - Discussed potential side effects of Chantix. - Advised follow-up with primary care for smoking cessation support. - I spent 3 minutes discussing tobacco cessation with the patient.  - Varenicline Tartrate, Starter, (CHANTIX STARTING MONTH PAK) 0.5 MG X 11 & 1 MG X 42 TBPK; Take 0.5 mg by mouth daily for 11 days, THEN 1 mg daily.  Dispense: 53 each; Refill: 0  3. Primary hypertension - Blood pressure elevated, likely exacerbated by smoking. No significant edema except for foot swelling due to injury. - Continue amlodipine and losartan . - Advised home blood pressure monitoring, target <140/90 mmHg. - Discussed lifestyle modifications: reduce salt, increase exercise.  Return if symptoms worsen or fail to improve.   Damien KATHEE Pringle, FNP  Note:  This document was prepared using Dragon voice recognition software and may include unintentional dictation errors.   "

## 2024-12-10 NOTE — Assessment & Plan Note (Signed)
-   Chronic nicotine dependence with current use of half a pack per day. Interested in quitting despite family influence. - Prescribed Chantix starter pack. - Discussed potential side effects of Chantix. - Advised follow-up with primary care for smoking cessation support. - I spent 3 minutes discussing tobacco cessation with the patient.  - Varenicline Tartrate, Starter, (CHANTIX STARTING MONTH PAK) 0.5 MG X 11 & 1 MG X 42 TBPK; Take 0.5 mg by mouth daily for 11 days, THEN 1 mg daily.  Dispense: 53 each; Refill: 0

## 2024-12-10 NOTE — Assessment & Plan Note (Signed)
-   Blood pressure elevated, likely exacerbated by smoking. No significant edema except for foot swelling due to injury. - Continue amlodipine and losartan . - Advised home blood pressure monitoring, target <140/90 mmHg. - Discussed lifestyle modifications: reduce salt, increase exercise.

## 2024-12-10 NOTE — Assessment & Plan Note (Signed)
-   Patient looks well on exam, with no wheezing or SOB. Suspected asthma with increased albuterol  use and symptoms indicative of exacerbation. No prior diagnosis but history of recurrent bronchitis and recent pneumonia. - Prescribed Symbicort once daily for maintenance therapy. - Provided refills for six months. - budesonide-formoterol (SYMBICORT) 80-4.5 MCG/ACT inhaler; Inhale 2 puffs into the lungs 2 (two) times daily.  Dispense: 1 each; Refill: 4

## 2025-07-18 ENCOUNTER — Ambulatory Visit
